# Patient Record
Sex: Female | Born: 1990 | Race: Black or African American | Hispanic: No | Marital: Single | Smoking: Former smoker
Health system: Southern US, Community
[De-identification: ages and names within clinical notes are randomized; demographics above are authoritative.]

## PROBLEM LIST (undated history)

## (undated) DIAGNOSIS — Z720 Tobacco use: Secondary | ICD-10-CM

## (undated) DIAGNOSIS — L239 Allergic contact dermatitis, unspecified cause: Secondary | ICD-10-CM

## (undated) DIAGNOSIS — I10 Essential (primary) hypertension: Secondary | ICD-10-CM

## (undated) DIAGNOSIS — L409 Psoriasis, unspecified: Secondary | ICD-10-CM

## (undated) DIAGNOSIS — I214 Non-ST elevation (NSTEMI) myocardial infarction: Secondary | ICD-10-CM

## (undated) HISTORY — PX: APPENDECTOMY: SHX54

## (undated) HISTORY — PX: NO PAST SURGERIES: SHX2092

---
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
## 2008-01-16 ENCOUNTER — Ambulatory Visit: Payer: Self-pay | Admitting: *Deleted

## 2008-01-16 ENCOUNTER — Ambulatory Visit: Payer: Self-pay | Admitting: Internal Medicine

## 2008-10-27 ENCOUNTER — Telehealth: Payer: Self-pay | Admitting: *Deleted

## 2008-11-28 ENCOUNTER — Ambulatory Visit: Payer: Self-pay | Admitting: Family Medicine

## 2009-01-07 ENCOUNTER — Telehealth: Payer: Self-pay | Admitting: Family Medicine

## 2009-02-03 ENCOUNTER — Encounter: Payer: Self-pay | Admitting: Family Medicine

## 2009-02-03 ENCOUNTER — Ambulatory Visit: Payer: Self-pay | Admitting: Family Medicine

## 2009-02-03 DIAGNOSIS — R112 Nausea with vomiting, unspecified: Secondary | ICD-10-CM | POA: Insufficient documentation

## 2009-02-03 DIAGNOSIS — N760 Acute vaginitis: Secondary | ICD-10-CM | POA: Insufficient documentation

## 2009-02-03 LAB — CONVERTED CEMR LAB: GC Probe Amp, Genital: NEGATIVE

## 2009-03-07 ENCOUNTER — Encounter (INDEPENDENT_AMBULATORY_CARE_PROVIDER_SITE_OTHER): Payer: Self-pay | Admitting: *Deleted

## 2009-03-07 DIAGNOSIS — F17200 Nicotine dependence, unspecified, uncomplicated: Secondary | ICD-10-CM | POA: Insufficient documentation

## 2009-09-11 ENCOUNTER — Ambulatory Visit: Payer: Self-pay | Admitting: Family Medicine

## 2009-09-11 DIAGNOSIS — L408 Other psoriasis: Secondary | ICD-10-CM | POA: Insufficient documentation

## 2010-07-20 NOTE — Assessment & Plan Note (Signed)
Summary: eczema,tcb   Vital Signs:  Patient profile:   20 year old female Height:      65.5 inches Weight:      114.4 pounds BMI:     18.82 Temp:     98.9 degrees F oral Pulse rate:   76 / minute BP sitting:   117 / 80  (right arm) Cuff size:   regular  Vitals Entered By: Garen Grams LPN (September 11, 2009 11:41 AM) CC: wants derm referral for eczema Is Patient Diabetic? No Pain Assessment Patient in pain? no        CC:  wants derm referral for eczema.  History of Present Illness: 1. Eczema:  Pt with chronic eczema.  She had seen Dermatologist in the past.  She is using the Triamcinolone frequently.  She is having to refill the small tube about every week.  She has it all over her body, worse on her extremities.  She does not use Vaseline or Eucerin because she was told to stop using it by a Dermatologist.  She takes short showers and uses Dove unscented soaps.  The eczema is very itchy and it really bothers her at night.  Has tried Benadryl which doesn't seem to help much.      ROS: denies any fevers or areas of skin breakdown that appear infected  2. Psoriasis:  She has scalp psoriasis.  This also diagnosed by a Dermatologist when she was younger.  She has been using the DermaSmooth on her scalp for years which does help.  She wants to know about any shampoo she can use to help.  Habits & Providers  Alcohol-Tobacco-Diet     Tobacco Status: current     Cigarette Packs/Day: 0.5  Current Medications (verified): 1)  Triamcinolone Acetonide 0.1 % Oint (Triamcinolone Acetonide) .... Apply To Affected Areas Two Times A Day To Three Times A Day As Needed 2)  Derma-Smoothe/fs Body 0.01 % Oil (Fluocinolone Acetonide) .... Apply Small Amount To Affected Area As Needed 3)  Hydroxyzine Hcl 25 Mg Tabs (Hydroxyzine Hcl) .... Take 1-2 Tabs At Night To Help With Itching  Allergies: No Known Drug Allergies  Past History:  Past Medical History: Extended hospitalization as infant for GI  disease? Psoriasis of scalp Severe eczema  Social History: Reviewed history from 08/17/2006 and no changes required. MGM and MGF have custody of pt and brother because mom wants to be free.   Current smoker - 1/2 ppd.  Denies illicit drug use.  Physical Exam  General:  vitals reviewed. alert and underweight appearing.  no acute distress Head:  normocephalic and atraumatic.   Mouth:  fair dentition, op without exudates.   Lungs:  normal respiratory effort.   Heart:  normal rate and regular rhythm.   Skin:  Scalp:  thinning of skin at the scalp line with some hypopigmentation.  Areas of psoriatic plaques in the scalp.  No alopecia. Skin: extensive areas of eczema.  Worse in arms and excoriations at the wrist.  Covers majority of skin surface.  No areas that appear infected. Psych:  not anxious appearing and not depressed appearing.     Impression & Recommendations:  Problem # 1:  ECZEMA, ATOPIC DERMATITIS (ICD-691.8) Assessment Unchanged  Is very extensive but she claims that it is under control.  Encouraged her to use an emoliant such as Vaseline.  She was told to stop using it before even though it seemed to be helping.  Refilled her Triamcinolone.  Would like her to  try and control the eczema by keeping the skin moisturized with Vaseline and then using the steroids intermittently for flares.  Also provided Hydroxyzine for the itching.  If not able to control it with these measures a Derm referral would probably be warranted. Her updated medication list for this problem includes:    Triamcinolone Acetonide 0.1 % Oint (Triamcinolone acetonide) .Marland Kitchen... Apply to affected areas two times a day to three times a day as needed    Derma-smoothe/fs Body 0.01 % Oil (Fluocinolone acetonide) .Marland Kitchen... Apply small amount to affected area as needed  Orders: [facility]- Est Level  3 (04540)  Problem # 2:  PSORIASIS, SCALP (ICD-696.1) Assessment: Unchanged  Still using the Derma-Smooth.  Concerned about the  chronic nature of her steroid use and possibility of skin thinning.  Recommended her to use T-gel or a tar based shampoo for chronic control and the steroids intermittently for flares.  Orders: [facility]- Est Level  3 (98119)  Problem # 3:  TOBACCO USER (ICD-305.1) Assessment: Unchanged  Counseled to quit  Orders: [facility]- Est Level  3 (14782)  Complete Medication List: 1)  Triamcinolone Acetonide 0.1 % Oint (Triamcinolone acetonide) .... Apply to affected areas two times a day to three times a day as needed 2)  Derma-smoothe/fs Body 0.01 % Oil (Fluocinolone acetonide) .... Apply small amount to affected area as needed 3)  Hydroxyzine Hcl 25 Mg Tabs (Hydroxyzine hcl) .... Take 1-2 tabs at night to help with itching  Patient Instructions: 1)  I have sent in a refill for the Triamcinolone cream 2)  You need to be sure to use a moisturizer like Vaseline or Eucerin 3)  There is a shampoo that is good for Psoriasis it is called T-Gel and you can get it over the counter.  Any tar based shampoo would work well. 4)  I have also sent in a prescription for an anti-itch medicine to the pharmacy. 5)  Please schedule a follow up appointment in 8 weeks to check on your eczema Prescriptions: HYDROXYZINE HCL 25 MG TABS (HYDROXYZINE HCL) Take 1-2 tabs at night to help with itching  #40 x 3   Entered and Authorized by:   Angelena Sole MD   Signed by:   Angelena Sole MD on 09/11/2009   Method used:   Electronically to        [facility] [phone]* (retail)       [address]       [place], [place]  [address]       Ph: [phone]       Fax: [phone]   RxID:   [id number] DERMA-SMOOTHE/FS BODY 0.01 % OIL (FLUOCINOLONE ACETONIDE) Apply small amount to affected area as needed  #1 x 6   Entered and Authorized by:   Angelena Sole MD   Signed by:   Angelena Sole MD on 09/11/2009   Method used:   Electronically to        [facility] [phone]* (retail)       [address]       [place], [place]  [address]       Ph: [phone]       Fax: [phone]   RxID:   [id number] TRIAMCINOLONE ACETONIDE 0.1 % OINT (TRIAMCINOLONE ACETONIDE) apply to affected areas two times a day to three times a day as needed  #1 x 6   Entered and Authorized by:   Angelena Sole MD   Signed by:   Vickki Muff  Lelon Perla MD on 09/11/2009   Method used:   Electronically to        [facility] [phone]* (retail)       [address]       [place], [place]  [address]       Ph: [phone]       Fax: [phone]   RxID:   [id number]

## 2011-03-10 LAB — POCT PREGNANCY, URINE
Operator id: 288831
Preg Test, Ur: NEGATIVE

## 2011-03-10 LAB — WET PREP, GENITAL

## 2011-05-29 ENCOUNTER — Emergency Department (HOSPITAL_COMMUNITY)
Admission: EM | Admit: 2011-05-29 | Discharge: 2011-05-29 | Disposition: A | Discharge status: Home / Self Care | Payer: Self-pay | Attending: Emergency Medicine | Admitting: Emergency Medicine

## 2011-05-29 ENCOUNTER — Encounter: Payer: Self-pay | Admitting: *Deleted

## 2011-05-29 DIAGNOSIS — L259 Unspecified contact dermatitis, unspecified cause: Secondary | ICD-10-CM | POA: Insufficient documentation

## 2011-05-29 DIAGNOSIS — L309 Dermatitis, unspecified: Secondary | ICD-10-CM

## 2011-05-29 DIAGNOSIS — L299 Pruritus, unspecified: Secondary | ICD-10-CM | POA: Insufficient documentation

## 2011-05-29 HISTORY — DX: Allergic contact dermatitis, unspecified cause: L23.9

## 2011-05-29 MED ORDER — TRIAMCINOLONE ACETONIDE 0.5 % EX OINT: TOPICAL_OINTMENT | Freq: Two times a day (BID) | CUTANEOUS | Status: DC

## 2011-05-29 MED ORDER — TRIAMCINOLONE ACETONIDE 0.1 % EX CREA: TOPICAL_CREAM | Freq: Two times a day (BID) | CUTANEOUS | Status: DC

## 2011-05-29 MED ORDER — HYDROXYZINE HCL 25 MG PO TABS: 25.0000 mg | ORAL_TABLET | Freq: Four times a day (QID) | ORAL | Status: DC

## 2011-05-29 NOTE — ED Provider Notes (Signed)
History     CSN: [id number] Arrival date & time: 05/29/2011 11:34 AM   First MD Initiated Contact with Patient 05/29/11 1250      Chief Complaint  Patient presents with  . Rash    (Consider location/radiation/quality/duration/timing/severity/associated sxs/prior treatment) Patient is a 20 y.o. female presenting with rash. The history is provided by the patient.  Rash   pt c/o eczematous rash, itching, esp to trunk and neck, w sparse involvement of extremities. Same symptoms previously x years. Requests refill of triamcinolone cream. Currently on no meds. Not using any regular moisturizer. Not using perfumed soaps or detergents. No new meds or change in meds, x out of steroid cream and antihistamine.  No fever or chills.   Past Medical History  Diagnosis Date  . Eczema, allergic     History reviewed. No pertinent past surgical history.  History reviewed. No pertinent family history.  History  Substance Use Topics  . Smoking status: Current Everyday Smoker -- 0.5 packs/day  . Smokeless tobacco: Not on file  . Alcohol Use: No    OB History    Grav Para Term Preterm Abortions TAB SAB Ect Mult Living                  Review of Systems  Constitutional: Negative for fever and chills.  Respiratory: Negative for shortness of breath and wheezing.   Cardiovascular: Negative for leg swelling.  Skin: Positive for rash.    Allergies  Review of patient's allergies indicates no known allergies.  Home Medications   Current Outpatient Rx  Name Route Sig Dispense Refill  . TRIAMCINOLONE ACETONIDE 0.1 % EX OINT Topical Apply 1 application topically 2 (two) times daily as needed. For itching     . HYDROXYZINE HCL 25 MG PO TABS Oral Take 25-50 mg by mouth at bedtime as needed. For itching      BP 132/71  Pulse 86  Temp(Src) 98.9 F (37.2 C) (Oral)  Resp 20  SpO2 100%  LMP 05/11/2011  Physical Exam  Nursing note and vitals reviewed. Constitutional: She appears  well-developed and well-nourished. No distress.  Eyes: Conjunctivae are normal. No scleral icterus.  Neck: Neck supple. No tracheal deviation present.  Cardiovascular: Normal rate.   Pulmonary/Chest: Effort normal. No respiratory distress.  Abdominal: Normal appearance.  Musculoskeletal: She exhibits no edema.  Neurological: She is alert.  Skin: Skin is warm and dry. Rash noted.       Eczematous rash to base of neck, upper trunk, sparse areas on extremities. No infection/cellulitis.   Psychiatric: She has a normal mood and affect.    ED Course  Procedures (including critical care time)     MDM  Pt requests refill of meds. Encouraged pcp/derm follow up.         Suzi Roots, MD 05/29/11 1302

## 2011-05-29 NOTE — ED Notes (Signed)
Reports having eczema, has rash and itching to back of neck.

## 2011-06-29 ENCOUNTER — Encounter (HOSPITAL_COMMUNITY): Payer: Self-pay | Admitting: *Deleted

## 2011-06-29 ENCOUNTER — Emergency Department (HOSPITAL_COMMUNITY)
Admission: EM | Admit: 2011-06-29 | Discharge: 2011-06-29 | Disposition: A | Discharge status: Home / Self Care | Payer: Self-pay

## 2011-06-29 NOTE — ED Notes (Signed)
Pt. Had unprotected sex and is concerned about having an STD. Pt. Has no s/s concerned about the unprotected sex that she had.

## 2011-06-29 NOTE — ED Notes (Signed)
Pt. reports that her eczema has gotten really bad and that she needs a prescription for it. Pt. Has sores on her neck and face.  Pt. Is requesting a script for the the larger tubes or the jar of the tricomceptalone cream.  Pt. Is asking for the larger amount because it is cheaper for her to purchase.

## 2011-06-29 NOTE — ED Notes (Signed)
Called back to stretcher 5 with no reply from triage waiting or main waiting.

## 2011-06-30 ENCOUNTER — Encounter (HOSPITAL_COMMUNITY): Payer: Self-pay

## 2011-06-30 ENCOUNTER — Emergency Department (HOSPITAL_COMMUNITY)
Admission: EM | Admit: 2011-06-30 | Discharge: 2011-06-30 | Disposition: A | Discharge status: Home / Self Care | Payer: Self-pay | Attending: Emergency Medicine | Admitting: Emergency Medicine

## 2011-06-30 DIAGNOSIS — R21 Rash and other nonspecific skin eruption: Secondary | ICD-10-CM | POA: Insufficient documentation

## 2011-06-30 DIAGNOSIS — L309 Dermatitis, unspecified: Secondary | ICD-10-CM

## 2011-06-30 DIAGNOSIS — L259 Unspecified contact dermatitis, unspecified cause: Secondary | ICD-10-CM | POA: Insufficient documentation

## 2011-06-30 MED ORDER — TRIAMCINOLONE ACETONIDE 0.1 % EX CREA: TOPICAL_CREAM | Freq: Two times a day (BID) | CUTANEOUS | Status: AC

## 2011-06-30 NOTE — ED Notes (Signed)
Discharge instructions reviewed with pt;  Verbalizes understanding.  Ambulatory to lobby

## 2011-06-30 NOTE — ED Notes (Signed)
Pt states that she has hx of ezcema and is requesting triamcinolone cream in an 80 gram tube for her rash.

## 2011-06-30 NOTE — ED Provider Notes (Signed)
History     CSN: [id number]  Arrival date & time 06/30/11  1045   First MD Initiated Contact with Patient 06/30/11 1222      Chief Complaint  Patient presents with  . Rash    (Consider location/radiation/quality/duration/timing/severity/associated sxs/prior treatment) Patient is a 21 y.o. female presenting with rash. The history is provided by the patient.  Rash  This is a chronic problem. The problem has not changed since onset.The problem is associated with nothing. There has been no fever. The pain is at a severity of 0/10. The patient is experiencing no pain. Pertinent negatives include no blisters, no itching, no pain and no weeping.   The patient has a history of a chronic eczema prefers to use Kenalog 0.1% ointment ran out of it a few days ago needs renewal cannot get in to see her primary care provider to get that done. No significant changes in her eczema she does not put the ointment on her face areas of eczema include mostly extremities and some on the trunk. No new or worse sxs. Past Medical History  Diagnosis Date  . Eczema, allergic     History reviewed. No pertinent past surgical history.  History reviewed. No pertinent family history.  History  Substance Use Topics  . Smoking status: Current Everyday Smoker -- 0.5 packs/day  . Smokeless tobacco: Not on file  . Alcohol Use: No    OB History    Grav Para Term Preterm Abortions TAB SAB Ect Mult Living                  Review of Systems  Constitutional: Negative for fever and chills.  HENT: Negative for neck pain.   Eyes: Negative for visual disturbance.  Respiratory: Negative for cough and shortness of breath.   Cardiovascular: Negative for chest pain.  Gastrointestinal: Negative for abdominal pain.  Genitourinary: Negative for dysuria.  Skin: Positive for rash. Negative for itching.  Neurological: Negative for headaches.  Hematological: Does not bruise/bleed easily.    Allergies  Review of  patient's allergies indicates no known allergies.  Home Medications   Current Outpatient Rx  Name Route Sig Dispense Refill  . TRIAMCINOLONE ACETONIDE 0.1 % EX OINT Topical Apply 1 application topically 2 (two) times daily as needed. For itching    . TRIAMCINOLONE ACETONIDE 0.1 % EX CREA Topical Apply topically 2 (two) times daily. Patient prefers this in an ointment if possible. 80 g 2    BP 112/75  Pulse 100  Temp(Src) 98.5 F (36.9 C) (Oral)  Resp 20  SpO2 100%  LMP 06/16/2011  Physical Exam  Nursing note and vitals reviewed. Constitutional: She is oriented to person, place, and time. She appears well-developed and well-nourished.  HENT:  Head: Normocephalic and atraumatic.  Mouth/Throat: Oropharynx is clear and moist.  Eyes: Conjunctivae and EOM are normal. Pupils are equal, round, and reactive to light.  Neck: Normal range of motion. Neck supple.  Cardiovascular: Normal rate, regular rhythm and normal heart sounds.   No murmur heard. Pulmonary/Chest: Effort normal and breath sounds normal.  Abdominal: Soft. Bowel sounds are normal. There is no tenderness.  Musculoskeletal: Normal range of motion. She exhibits no edema and no tenderness.  Lymphadenopathy:    She has no cervical adenopathy.  Neurological: She is alert and oriented to person, place, and time. No cranial nerve deficit. She exhibits normal muscle tone. Coordination normal.  Skin: Skin is warm. Rash noted.       Scattered areas of  eczema patches on the upper extremities and trunk. None on face.    ED Course  Procedures (including critical care time)  Labs Reviewed - No data to display No results found.   1. Eczema       MDM   Patient with long-standing history of eczema essentially here for refill on her Kenalog ointment she does have a primary care provider that she follows with was not able to get in to see him and she is out of her eczema medication. Prescription renewed for her.          Shelda Jakes, MD 06/30/11 1332

## 2013-03-15 ENCOUNTER — Emergency Department (HOSPITAL_COMMUNITY)
Admission: EM | Admit: 2013-03-15 | Discharge: 2013-03-15 | Disposition: A | Discharge status: Home / Self Care | Payer: Self-pay | Attending: Emergency Medicine | Admitting: Emergency Medicine

## 2013-03-15 ENCOUNTER — Encounter (HOSPITAL_COMMUNITY): Payer: Self-pay

## 2013-03-15 DIAGNOSIS — L309 Dermatitis, unspecified: Secondary | ICD-10-CM

## 2013-03-15 DIAGNOSIS — L259 Unspecified contact dermatitis, unspecified cause: Secondary | ICD-10-CM | POA: Insufficient documentation

## 2013-03-15 DIAGNOSIS — M67431 Ganglion, right wrist: Secondary | ICD-10-CM

## 2013-03-15 DIAGNOSIS — Z79899 Other long term (current) drug therapy: Secondary | ICD-10-CM | POA: Insufficient documentation

## 2013-03-15 DIAGNOSIS — M6740 Ganglion, unspecified site: Secondary | ICD-10-CM | POA: Insufficient documentation

## 2013-03-15 DIAGNOSIS — F17200 Nicotine dependence, unspecified, uncomplicated: Secondary | ICD-10-CM | POA: Insufficient documentation

## 2013-03-15 MED ORDER — TRIAMCINOLONE ACETONIDE 0.1 % EX CREA: TOPICAL_CREAM | Freq: Two times a day (BID) | CUTANEOUS | Status: DC

## 2013-03-15 NOTE — ED Notes (Signed)
Pt presents with a "cyst" to her Right posterior wrist x1 year, pt is requesting to have area removed. Pt also requesting a prescription for her eczema.

## 2013-03-15 NOTE — ED Provider Notes (Signed)
CSN: [id number]     Arrival date & time 03/15/13  4098 History  This chart was scribed for non-physician practitioner Magnus Sinning, PA-C working with Loren Racer, MD by Danella Maiers, ED Scribe. This patient was seen in room TR07C/TR07C and the patient's care was started at 11:35 PM.   Chief Complaint  Patient presents with  . Cyst   The history is provided by the patient. No language interpreter was used.   HPI Comments: Janice Esparza is a 22 y.o. female who presents to the Emergency Department complaining of a growing cyst to the dorsal aspect of the right wrist onset 8 months ago. She denies any pain or erythema to the area. She denies fever, chills, numbness. She also reports eczema to the arms, legs, and back.  She is requesting a refill of Triamcinolone prescription.     Past Medical History  Diagnosis Date  . Eczema, allergic    History reviewed. No pertinent past surgical history. History reviewed. No pertinent family history. History  Substance Use Topics  . Smoking status: Current Every Day Smoker -- 0.50 packs/day  . Smokeless tobacco: Not on file  . Alcohol Use: No   OB History   Grav Para Term Preterm Abortions TAB SAB Ect Mult Living                 Review of Systems  All other systems reviewed and are negative.    Allergies  Review of patient's allergies indicates no known allergies.  Home Medications   Current Outpatient Rx  Name  Route  Sig  Dispense  Refill  . triamcinolone ointment (KENALOG) 0.1 %   Topical   Apply 1 application topically 2 (two) times daily as needed. For itching          BP 126/84  Pulse 72  Temp(Src) 98.6 F (37 C) (Oral)  Resp 16  Ht 5\' 6"  (1.676 m)  Wt 115 lb (52.164 kg)  BMI 18.57 kg/m2  SpO2 99%  LMP 03/11/2013 Physical Exam  Nursing note and vitals reviewed. Constitutional: She appears well-developed and well-nourished.  HENT:  Head: Normocephalic and atraumatic.  Mouth/Throat: Oropharynx is clear  and moist.  Eyes: EOM are normal. Pupils are equal, round, and reactive to light.  Neck: Normal range of motion. Neck supple.  Cardiovascular: Normal rate, regular rhythm and normal heart sounds.   Pulmonary/Chest: Effort normal and breath sounds normal. She has no wheezes.  Musculoskeletal: Normal range of motion.       Right wrist: She exhibits normal range of motion and no tenderness.  Neurological: She is alert.  Skin: Skin is warm and dry.  Large 2cm ganglion cyst of the dorsal aspect of the right wrist.  No overlying erythema or warmth.  2+ dorsal pedis pulses bilaterally. Sensation of all fingers of right hand  Is intact.  Psychiatric: She has a normal mood and affect. Her behavior is normal.    ED Course  Procedures (including critical care time) Medications - No data to display  DIAGNOSTIC STUDIES: Oxygen Saturation is 99% on room air, normal by my interpretation.    COORDINATION OF CARE: 12:03 PM- Discussed treatment plan with pt which includes referral to hand surgeon and pt agrees to plan.    Labs Review Labs Reviewed - No data to display Imaging Review No results found.  MDM  No diagnosis found. Patient with a ganglion cyst to the dorsal aspect of the right wrist.  Patient given referral to Hand  Surgery to follow up with as needed.  I personally performed the services described in this documentation, which was scribed in my presence. The recorded information has been reviewed and is accurate.    Pascal Lux [place], PA-C 03/15/13 1513

## 2013-03-17 NOTE — ED Provider Notes (Signed)
Medical screening examination/treatment/procedure(s) were performed by non-physician practitioner and as supervising physician I was immediately available for consultation/collaboration.   Jadis Pitter, MD 03/17/13 1504 

## 2014-02-18 ENCOUNTER — Emergency Department (HOSPITAL_COMMUNITY)
Admission: EM | Admit: 2014-02-18 | Discharge: 2014-02-18 | Disposition: A | Discharge status: Home / Self Care | Payer: Self-pay | Attending: Emergency Medicine | Admitting: Emergency Medicine

## 2014-02-18 ENCOUNTER — Encounter (HOSPITAL_COMMUNITY): Payer: Self-pay | Admitting: Emergency Medicine

## 2014-02-18 ENCOUNTER — Emergency Department (HOSPITAL_COMMUNITY): Payer: Self-pay

## 2014-02-18 DIAGNOSIS — J029 Acute pharyngitis, unspecified: Secondary | ICD-10-CM | POA: Insufficient documentation

## 2014-02-18 DIAGNOSIS — F17200 Nicotine dependence, unspecified, uncomplicated: Secondary | ICD-10-CM | POA: Insufficient documentation

## 2014-02-18 DIAGNOSIS — J36 Peritonsillar abscess: Secondary | ICD-10-CM | POA: Insufficient documentation

## 2014-02-18 DIAGNOSIS — IMO0002 Reserved for concepts with insufficient information to code with codable children: Secondary | ICD-10-CM | POA: Insufficient documentation

## 2014-02-18 LAB — CBC
HCT: 35.4 % — ABNORMAL LOW (ref 36.0–46.0)
Hemoglobin: 12.2 g/dL (ref 12.0–15.0)
MCH: 30.3 pg (ref 26.0–34.0)
MCHC: 34.5 g/dL (ref 30.0–36.0)
MCV: 88.1 fL (ref 78.0–100.0)
PLATELETS: 222 10*3/uL (ref 150–400)
RBC: 4.02 MIL/uL (ref 3.87–5.11)
RDW: 11.7 % (ref 11.5–15.5)
WBC: 7 10*3/uL (ref 4.0–10.5)

## 2014-02-18 LAB — BASIC METABOLIC PANEL
ANION GAP: 13 (ref 5–15)
BUN: 12 mg/dL (ref 6–23)
CALCIUM: 9.3 mg/dL (ref 8.4–10.5)
CO2: 24 meq/L (ref 19–32)
CREATININE: 0.56 mg/dL (ref 0.50–1.10)
Chloride: 101 mEq/L (ref 96–112)
GFR calc Af Amer: >90 mL/min (ref >90)
GLUCOSE: 79 mg/dL (ref 70–99)
Potassium: 3.8 mEq/L (ref 3.7–5.3)
Sodium: 138 mEq/L (ref 137–147)

## 2014-02-18 LAB — MONONUCLEOSIS SCREEN: MONO SCREEN: NEGATIVE

## 2014-02-18 LAB — RAPID STREP SCREEN (MED CTR MEBANE ONLY): Streptococcus, Group A Screen (Direct): NEGATIVE

## 2014-02-18 MED ORDER — IOHEXOL 300 MG/ML  SOLN
80.0000 mL | Freq: Once | INTRAMUSCULAR | Status: AC | PRN
  Administered 2014-02-18: 80 mL via INTRAVENOUS

## 2014-02-18 MED ORDER — CLINDAMYCIN PHOSPHATE 600 MG/50ML IV SOLN
600.0000 mg | Freq: Once | INTRAVENOUS | Status: AC
  Administered 2014-02-18: 600 mg via INTRAVENOUS
  Filled 2014-02-18: qty 50

## 2014-02-18 MED ORDER — DEXAMETHASONE SODIUM PHOSPHATE 10 MG/ML IJ SOLN
10.0000 mg | Freq: Once | INTRAMUSCULAR | Status: AC
  Administered 2014-02-18: 10 mg via INTRAVENOUS
  Filled 2014-02-18: qty 1

## 2014-02-18 MED ORDER — CLINDAMYCIN HCL 150 MG PO CAPS: 300.0000 mg | ORAL_CAPSULE | Freq: Four times a day (QID) | ORAL | Status: AC

## 2014-02-18 MED ORDER — OXYCODONE-ACETAMINOPHEN 5-325 MG PO TABS: 1.0000 | ORAL_TABLET | Freq: Once | ORAL | Status: DC

## 2014-02-18 MED ORDER — OXYCODONE-ACETAMINOPHEN 5-325 MG PO TABS
1.0000 | ORAL_TABLET | Freq: Once | ORAL | Status: AC
  Administered 2014-02-18: 1 via ORAL
  Filled 2014-02-18: qty 1

## 2014-02-18 NOTE — ED Notes (Signed)
Pt presents with a sore throat since last Wednesday with difficulty swallowing.  Significant swelling noted to left side of throat- denies any difficulty breathing.  Airway intact.

## 2014-02-18 NOTE — ED Notes (Signed)
Patient discharged with all persona belongings.

## 2014-02-18 NOTE — ED Provider Notes (Signed)
CSN: [id number]     Arrival date & time 02/18/14  1504 History   First MD Initiated Contact with Patient 02/18/14 1756     Chief Complaint  Patient presents with  . Sore Throat    Patient is a 23 y.o. female presenting with pharyngitis.  Sore Throat This is a new problem. Episode onset: 1 wk. The problem occurs constantly. The problem has been gradually worsening. Associated symptoms include a sore throat. Pertinent negatives include no anorexia, chills, congestion, coughing, fever, headaches, nausea, neck pain, rash or vomiting. The symptoms are aggravated by drinking and eating. She has tried nothing for the symptoms.   Some drooling noted, has been able to eat solid/liquids but has pain on swallowing.  No pain with ranging neck.   Past Medical History  Diagnosis Date  . Eczema, allergic    History reviewed. No pertinent past surgical history. No family history on file. History  Substance Use Topics  . Smoking status: Current Every Day Smoker -- 0.50 packs/day  . Smokeless tobacco: Not on file  . Alcohol Use: Yes   OB History   Grav Para Term Preterm Abortions TAB SAB Ect Mult Living                 Review of Systems  Constitutional: Negative for fever and chills.  HENT: Positive for sore throat. Negative for congestion.   Respiratory: Negative for cough.   Gastrointestinal: Negative for nausea, vomiting and anorexia.  Musculoskeletal: Negative for neck pain.  Skin: Negative for rash.  Neurological: Negative for headaches.    Allergies  Review of patient's allergies indicates no known allergies.  Home Medications   Prior to Admission medications   Medication Sig Start Date End Date Taking? Authorizing Provider  triamcinolone cream (KENALOG) 0.1 % Apply topically 2 (two) times daily. 03/15/13   Heather Laisure, PA-C  triamcinolone ointment (KENALOG) 0.1 % Apply 1 application topically 2 (two) times daily as needed. For itching    Historical Provider, MD   BP 119/74   Pulse 94  Temp(Src) 100 F (37.8 C) (Oral)  Resp 18  Ht  (1.676 m)  Wt 115 lb (52.164 kg)  BMI 18.57 kg/m2  SpO2 99%  LMP 01/27/2014 Physical Exam  Nursing note and vitals reviewed. Constitutional: She is oriented to person, place, and time. She appears well-developed and well-nourished.  Handling secretions. Normal phonation.   HENT:  Head: Normocephalic and atraumatic.  Nose: Nose normal.  Left peritonsillar edema/erythema/fluctuance.  Floor space soft.  1.5 finger mouth opening.   Eyes: Conjunctivae are normal.  Neck: Normal range of motion. Neck supple. No tracheal deviation present.  TTP to left neck, FROM without pain  Cardiovascular: Normal rate, regular rhythm and normal heart sounds.   No murmur heard. Pulmonary/Chest: Effort normal and breath sounds normal. No stridor. No respiratory distress. She has no wheezes. She has no rales.  Abdominal: Soft. Bowel sounds are normal. She exhibits no distension and no mass. There is no tenderness.  Musculoskeletal: Normal range of motion. She exhibits no edema.  Neurological: She is alert and oriented to person, place, and time.  Skin: Skin is warm and dry. No rash noted.  Psychiatric: She has a normal mood and affect.    ED Course  Procedures (including critical care time) Labs Review Labs Reviewed  CBC - Abnormal; Notable for the following:    HCT 35.4 (*)    All other components within normal limits  RAPID STREP SCREEN  CULTURE, GROUP  A STREP  BASIC METABOLIC PANEL  MONONUCLEOSIS SCREEN    Imaging Review Ct Soft Tissue Neck W Contrast  02/18/2014   CLINICAL DATA:  Sore throat and suspected peritonsillar abscess  EXAM: CT NECK WITH CONTRAST  TECHNIQUE: Multidetector CT imaging of the neck was performed using the standard protocol following the bolus administration of intravenous contrast.  CONTRAST:  80mL OMNIPAQUE IOHEXOL 300 MG/ML  SOLN  COMPARISON:  None.  FINDINGS: There is considerable swelling of the  peritonsillar soft tissues on the left. There is irregular low density measuring 1.5 cm consistent with an abscess. Subtle less well defined low density extends posteriorly into the prevertebral space in the midline and to the left consistent with extension of inflammation. The right peritonsillar soft tissues exhibit mild enhancement and only minimal swelling and no evidence of an abscess. There is edema of the soft palate. There is mild shift of the airway towards the right.  There is no bulky anterior or posterior cervical lymphadenopathy. The parotid and submandibular glands are normal. The epiglottis and laryngeal structures are normal. The thyroid lobes enhance normally. The jugular and carotid vessels are normal.  There is reversal of the normal cervical lordosis consistent with muscle spasm. There is mild fullness the prevertebral soft tissues as discussed above suggesting some extension of inflammation without a discrete abscess.  IMPRESSION: 1. There is a left-sided peritonsillar and retroperitoneal abscess with extension posteriorly into the upper prevertebral space. There is mild mass effect upon the nasopharygeal and oropharyngeal airways. There is mild inflammatory change on the left without evidence of a discrete abscess. 2. There is no bulky lymphadenopathy. 3. These results were called by telephone at the time of interpretation on 02/18/2014 at 7:35 pm to Dr. Sofie Rower , who verbally acknowledged these results.   Electronically Signed   By: David  [place]   On: 02/18/2014 19:40     EKG Interpretation None      MDM   Final diagnoses:  Peritonsillar abscess    Pt presents with sore throat for one week, worsening.  Floor space soft. Low grade temp here.  No change in phonation appreciated, not drooling, airway protected without stridor. Exam c.w. left PTA, CT confirms.  FROM of neck without pain to indicate RPA, but CT does suggest retropharyngeal extension.  Gave decadron and clinda.  Mono and strep neg.   7:41 PM CT results in. ENT consulted, they want to see pt 9AM tomorrow as long as airway is patent.   8:05 PM Feels better, easier to drink and talk.  No dyspnea or stridor.    Dc home with clinda, analgesia.  Discussed strict return precautions for difficulty swallowing, breathing.     Sofie Rower, MD 02/19/14 [phone]

## 2014-02-19 NOTE — ED Provider Notes (Signed)
This patient was seen in conjunction with the resident physician.  The documentation accurately reflects the patient's ED encounter.  On my exam, the patient had trismus, pain in the left lateral throat. CT scan, which I reviewed, agree with the interpretation, demonstrates abscess, with some concern for progression to the prevertebral space. Patient improved substantially here with steroids, analgesics. We discussed her care with ENT Pollyann Kennedy) who requests to see the patient first thing in the AM.   Gerhard Munch, MD 02/19/14 [phone]

## 2014-02-20 LAB — CULTURE, GROUP A STREP

## 2014-02-25 ENCOUNTER — Emergency Department (HOSPITAL_COMMUNITY)
Admission: EM | Admit: 2014-02-25 | Discharge: 2014-02-25 | Disposition: A | Discharge status: Home / Self Care | Payer: Self-pay | Attending: Emergency Medicine | Admitting: Emergency Medicine

## 2014-02-25 ENCOUNTER — Encounter (HOSPITAL_COMMUNITY): Payer: Self-pay | Admitting: Emergency Medicine

## 2014-02-25 DIAGNOSIS — J029 Acute pharyngitis, unspecified: Secondary | ICD-10-CM | POA: Insufficient documentation

## 2014-02-25 DIAGNOSIS — J36 Peritonsillar abscess: Secondary | ICD-10-CM | POA: Insufficient documentation

## 2014-02-25 DIAGNOSIS — Z872 Personal history of diseases of the skin and subcutaneous tissue: Secondary | ICD-10-CM | POA: Insufficient documentation

## 2014-02-25 DIAGNOSIS — F17200 Nicotine dependence, unspecified, uncomplicated: Secondary | ICD-10-CM | POA: Insufficient documentation

## 2014-02-25 DIAGNOSIS — R6883 Chills (without fever): Secondary | ICD-10-CM | POA: Insufficient documentation

## 2014-02-25 MED ORDER — CLINDAMYCIN PHOSPHATE 600 MG/50ML IV SOLN
600.0000 mg | Freq: Once | INTRAVENOUS | Status: AC
  Administered 2014-02-25: 600 mg via INTRAVENOUS
  Filled 2014-02-25: qty 50

## 2014-02-25 MED ORDER — DEXAMETHASONE SODIUM PHOSPHATE 10 MG/ML IJ SOLN
10.0000 mg | Freq: Once | INTRAMUSCULAR | Status: AC
  Administered 2014-02-25: 10 mg via INTRAVENOUS
  Filled 2014-02-25: qty 1

## 2014-02-25 MED ORDER — SODIUM CHLORIDE 0.9 % IV BOLUS (SEPSIS)
1000.0000 mL | Freq: Once | INTRAVENOUS | Status: AC
  Administered 2014-02-25: 1000 mL via INTRAVENOUS

## 2014-02-25 NOTE — ED Notes (Signed)
Pt in from home c/o sore throat, states, "I was here last week & was told I had an abcess in my throat. I haven't been able to get in with where they told me to go. I can't afford my antibiotics until this Friday." pt c/o dysphagia, pt speech clear, no active drooling

## 2014-02-25 NOTE — ED Provider Notes (Signed)
CSN: [id number]     Arrival date & time 02/25/14  0749 History   First MD Initiated Contact with Patient 02/25/14 [phone]     Chief Complaint  Patient presents with  . Sore Throat     (Consider location/radiation/quality/duration/timing/severity/associated sxs/prior Treatment) HPI Comments: Patient presents emergency department with chief complaints of peritonsillar abscess. She states that she was seen here last week for the same, and was treated with clindamycin and Decadron. She was discharged to home with instructions followup with ENT the following day, but states that she was unable to because of insurance reasons. She states that additionally, she cannot afford to fill her prescription clindamycin. She states the throat and sore all week, but she did not notice significant swelling until this morning when she awoke. She denies measured temperature, but does report having chills. She states the pain is worsened with swallowing.  The history is provided by the patient. No language interpreter was used.    Past Medical History  Diagnosis Date  . Eczema, allergic    History reviewed. No pertinent past surgical history. No family history on file. History  Substance Use Topics  . Smoking status: Current Every Day Smoker -- 0.50 packs/day    Types: Cigarettes  . Smokeless tobacco: Not on file  . Alcohol Use: Yes   OB History   Grav Para Term Preterm Abortions TAB SAB Ect Mult Living                 Review of Systems  Constitutional: Positive for chills. Negative for fever.  HENT: Positive for sore throat.   Respiratory: Negative for shortness of breath.   Cardiovascular: Negative for chest pain.  Gastrointestinal: Negative for nausea, vomiting, diarrhea and constipation.  Genitourinary: Negative for dysuria.  All other systems reviewed and are negative.     Allergies  Review of patient's allergies indicates no known allergies.  Home Medications   Prior to Admission  medications   Medication Sig Start Date End Date Taking? Authorizing Provider  clindamycin (CLEOCIN) 150 MG capsule Take 2 capsules (300 mg total) by mouth every 6 (six) hours. 02/18/14 02/25/14  Sofie Rower, MD  oxyCODONE-acetaminophen (PERCOCET/ROXICET) 5-325 MG per tablet Take 1 tablet by mouth once. 02/18/14   Sofie Rower, MD   BP 119/83  Pulse 82  Temp(Src) 99.5 F (37.5 C) (Oral)  Ht  (1.676 m)  Wt 115 lb (52.164 kg)  BMI 18.57 kg/m2  SpO2 100%  LMP 02/25/2014 Physical Exam  Nursing note and vitals reviewed. Constitutional: She is oriented to person, place, and time. She appears well-developed and well-nourished.  HENT:  Head: Normocephalic and atraumatic.  Left-sided peritonsillar abscess with some exudates, tolerating secretions, airway is intact, no stridor, positive "hot potato voice"  Eyes: Conjunctivae and EOM are normal. Pupils are equal, round, and reactive to light.  Neck: Normal range of motion. Neck supple.  Cardiovascular: Normal rate and regular rhythm.  Exam reveals no gallop and no friction rub.   No murmur heard. Pulmonary/Chest: Effort normal and breath sounds normal. No respiratory distress. She has no wheezes. She has no rales. She exhibits no tenderness.  Abdominal: Soft. Bowel sounds are normal. She exhibits no distension and no mass. There is no tenderness. There is no rebound and no guarding.  Musculoskeletal: Normal range of motion. She exhibits no edema and no tenderness.  Neurological: She is alert and oriented to person, place, and time.  Skin: Skin is warm and dry.  Psychiatric: She has a  normal mood and affect. Her behavior is normal. Judgment and thought content normal.    ED Course  Procedures (including critical care time) Labs Review Labs Reviewed - No data to display  Imaging Review No results found.   EKG Interpretation None      MDM   Final diagnoses:  Peritonsillar abscess    Patient with peritonsillar abscess.  Will treat  with clinda and decadron.  Patient discussed with Dr. Effie Shy, who recommends consulting Dr. Pollyann Kennedy, who was originally consulted on this patient.  8:43 AM Patient discussed with Dr. Annalee Genta from ENT.  Dr. Pollyann Kennedy is out of the office.  Dr. Annalee Genta recommends follow-up in his office at 1:00 today.  Recommends giving 2 L of fluid in addition to the clinda and decadron that we are giving now.  11:49 AM Finished fluids, DC to Dr. Lucky Rathke office.  Symptoms unchanged, protecting airway.   Roxy Horseman, PA-C 02/25/14 1150

## 2014-02-25 NOTE — ED Notes (Signed)
Patient was given a cup of ice water. 

## 2014-02-25 NOTE — Discharge Instructions (Signed)
Peritonsillar Abscess °A peritonsillar abscess is a collection of pus located in the back of the throat behind the tonsils. It usually occurs when a streptococcal infection of the throat or tonsils spreads into the space around the tonsils. They are almost always caused by the streptococcal germ (bacteria). The treatment of a peritonsillar abscess is most often drainage accomplished by putting a needle into the abscess or cutting (incising) and draining the abscess. This is most often followed with a course of antibiotics. °HOME CARE INSTRUCTIONS °· If your abscess was drained by your caregiver today, rinse your throat (gargle) with warm salt water four times per day or as needed for comfort. Do not swallow this mixture. Mix 1 teaspoon of salt in 8 ounces of warm water for gargling. °· Rest in bed as needed. Resume activities as able. °· Apply cold to your neck for pain relief. Fill a plastic bag with ice and wrap it in a towel. Hold the ice on your neck for 20 minutes 4 times per day. °· Eat a soft or liquid diet as tolerated while your throat remains sore. Popsicles and ice cream may be good early choices. Drinking plenty of cold fluids will probably be soothing and help take swelling down in between the warm gargles. °· Only take over-the-counter or prescription medicines for pain, discomfort, or fever as directed by your caregiver. Do not use aspirin unless directed by your physician. Aspirin slows down the clotting process. It can also cause bleeding from the drainage area if this was needled or incised today. °· If antibiotics were prescribed, take them as directed for the full course of the prescription. Even if you feel you are well, you need to take them. °SEEK MEDICAL CARE IF:  °· You have increased pain, swelling, redness, or drainage in your throat. °· You develop signs of infection such as dizziness, headache, lethargy, or generalized feelings of illness. °· You have difficulty breathing, swallowing or  eating. °· You show signs of becoming dehydrated (lightheadedness when standing, decreased urine output, a fast heart rate, or dry mouth and mucous membranes). °SEEK IMMEDIATE MEDICAL CARE IF:  °· You have a fever. °· You are coughing up or vomiting blood. °· You develop more severe throat pain uncontrolled with medicines or you start to drool. °· You develop difficulty breathing, talking, or find it easier to breathe while leaning forward. °Document Released: 06/06/2005 Document Revised: 08/29/2011 Document Reviewed: 01/18/2008 °ExitCare® Patient Information ©2015 [employer]. This information is not intended to replace advice given to you by your health care provider. Make sure you discuss any questions you have with your health care provider. ° °

## 2014-02-27 NOTE — ED Provider Notes (Signed)
Medical screening examination/treatment/procedure(s) were performed by non-physician practitioner and as supervising physician I was immediately available for consultation/collaboration.   EKG Interpretation None       Shaquela Weichert L Cailee Blanke, MD 02/27/14 1413 

## 2014-05-26 ENCOUNTER — Encounter (HOSPITAL_COMMUNITY): Payer: Self-pay | Admitting: Physical Medicine and Rehabilitation

## 2014-05-26 ENCOUNTER — Emergency Department (HOSPITAL_COMMUNITY)
Admission: EM | Admit: 2014-05-26 | Discharge: 2014-05-26 | Disposition: A | Discharge status: Home / Self Care | Payer: Self-pay | Attending: Emergency Medicine | Admitting: Emergency Medicine

## 2014-05-26 DIAGNOSIS — H019 Unspecified inflammation of eyelid: Secondary | ICD-10-CM | POA: Insufficient documentation

## 2014-05-26 DIAGNOSIS — Z720 Tobacco use: Secondary | ICD-10-CM | POA: Insufficient documentation

## 2014-05-26 DIAGNOSIS — L309 Dermatitis, unspecified: Secondary | ICD-10-CM | POA: Insufficient documentation

## 2014-05-26 DIAGNOSIS — H5789 Other specified disorders of eye and adnexa: Secondary | ICD-10-CM

## 2014-05-26 MED ORDER — TETRACAINE HCL 0.5 % OP SOLN
1.0000 [drp] | Freq: Once | OPHTHALMIC | Status: AC
  Administered 2014-05-26: 1 [drp] via OPHTHALMIC
  Filled 2014-05-26: qty 2

## 2014-05-26 MED ORDER — ERYTHROMYCIN 5 MG/GM OP OINT: TOPICAL_OINTMENT | OPHTHALMIC | Status: DC

## 2014-05-26 MED ORDER — FLUORESCEIN SODIUM 1 MG OP STRP
1.0000 | ORAL_STRIP | Freq: Once | OPHTHALMIC | Status: AC
  Administered 2014-05-26: 1 via OPHTHALMIC
  Filled 2014-05-26: qty 1

## 2014-05-26 NOTE — ED Provider Notes (Signed)
CSN: [id number]     Arrival date & time 05/26/14  [id number] History   First MD Initiated Contact with Patient 05/26/14 [phone]     Chief Complaint  Patient presents with  . Eye Pain     (Consider location/radiation/quality/duration/timing/severity/associated sxs/prior Treatment) HPI Pt is a 23yo female with hx of eczema presenting to ED with c/o 2-3 month hx of gradually worsening bilateral upper eyelid irritation with discharge. 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  States her eyelids feel itchy but do not hurt. Denies change in vision.  Pt does not wear contacts or eyeglasses. Reports trying vaseline at night to help w/o relief.  Also reports contemplating using her triamcinolone cream she uses for her eczema but she has not tried yet as she was concerned it would make things worse. She does not have an optometrist or ophthalmologist. Denies fever, chills, cough, or congestion. No new soaps or detergents. No known allergies.   Past Medical History  Diagnosis Date  . Eczema, allergic    History reviewed. No pertinent past surgical history. No family history on file. History  Substance Use Topics  . Smoking status: Current Every Day Smoker -- 0.50 packs/day    Types: Cigarettes  . Smokeless tobacco: Not on file  . Alcohol Use: Yes   OB History    No data available     Review of Systems  Constitutional: Negative for fever and chills.  HENT: Negative for congestion, rhinorrhea and sore throat.   Eyes: Positive for pain, discharge ( eyelids), redness and itching. Negative for photophobia and visual disturbance.  Respiratory: Negative for cough and shortness of breath.   Gastrointestinal: Negative for nausea and vomiting.  Neurological: Negative for light-headedness and headaches.  All other systems reviewed and are  negative.     Allergies  Review of patient's allergies indicates no known allergies.  Home Medications   Prior to Admission medications   Medication Sig Start Date End Date Taking? Authorizing Provider  erythromycin ophthalmic ointment Place a 1/2 inch ribbon of ointment onto upper eyelid. 05/26/14   Junius FinnerErin O'Malley, PA-C  oxyCODONE-acetaminophen (PERCOCET/ROXICET) 5-325 MG per tablet Take 1 tablet by mouth once. Patient not taking: Reported on 05/26/2014 02/18/14   Sofie RowerMike Stengel, MD   BP 125/86 mmHg  Pulse 63  Temp(Src) 98.8 F (37.1 C) (Oral)  Resp 16  Ht 5\' 6"  (1.676 m)  Wt 115 lb (52.164 kg)  BMI 18.57 kg/m2  SpO2 100% Physical Exam  Constitutional: She is oriented to person, place, and time. She appears well-developed and well-nourished.  HENT:  Head: Normocephalic and atraumatic.  Eyes: Conjunctivae and EOM are normal. Pupils are equal, round, and reactive to light. Lids are everted and swept, no foreign bodies found. Right eye exhibits discharge and hordeolum. Right eye exhibits no chemosis and no exudate. No foreign body present in the right eye. Left eye exhibits discharge. Left eye exhibits no chemosis, no exudate and no hordeolum. No foreign body present in the left eye. Right conjunctiva is not injected. Right conjunctiva has no hemorrhage. Left conjunctiva is not injected. Left conjunctiva has no hemorrhage. No scleral icterus.  Slit lamp exam:      The right eye shows no corneal abrasion, no corneal ulcer, no foreign body and no fluorescein uptake.       The left  eye shows no corneal abrasion, no corneal ulcer, no foreign body and no fluorescein uptake.  Bilateral upper eyelid erythema and irritation with scant white/yellow discharge to right upper eyelid and scant red blood. Minimal eyelashes present.   Neck: Normal range of motion.  Cardiovascular: Normal rate.   Pulmonary/Chest: Effort normal.  Musculoskeletal: Normal range of motion.  Neurological: She is alert and  oriented to person, place, and time.  Skin: Skin is warm and dry.  Psychiatric: She has a normal mood and affect. Her behavior is normal.  Nursing note and vitals reviewed.   ED Course  Procedures (including critical care time) Labs Review Labs Reviewed - No data to display  Imaging Review No results found.   EKG Interpretation None      MDM   Final diagnoses:  Infection of eyelid  Irritation of both eyes  Eczema   Pt c/o bilateral upper eyelid irritation and crusting for 2-3 months.  Coworkers are now concerned she has pink eye.  Exam is c/w blepharitis. Will tx with erythromycin ointment. Home care instructions provided. Also advised pt to f/u with PCP and/or Dr. Allena KatzPatel, ophthalmology in 1 week if symptoms not improving. Pt verbalized understanding and agreement with tx plan.   Junius Finnerrin O'Malley, PA-C 05/26/14 1058  Nathan R. Rubin PayorPickering, MD 05/27/14 1255

## 2014-05-26 NOTE — Discharge Instructions (Signed)
Use the antibiotic ointment for up to 5 days.   If no improvement after 3 days, or symptoms worsening, discontinue the use of the medication and seek medical evaluation for recheck of symptoms. See below for further instructions.   Blepharitis Blepharitis is a skin problem that makes your eyelids watery, red, puffy (swollen), crusty, scaly, or painful. It may also make your eyes itch. You may lose eyelashes. HOME CARE  Keep your hands clean.  Use a clean towel each time you dry your eyelids. Do not share towels or makeup with anyone.  Carefully wash your eyelids and eyelashes 2 times a day. Use warm water and baby shampoo or just water.  Wash your face and eyebrows at least once a day.  Hold a folded washcloth under warm water. Squeeze the water out. Put the warm washcloth on your eyes 2 times a day for 10 minutes, or as told by your doctor.  Apply medicated cream as told by your doctor.  Avoid rubbing your eyes.  Avoid wearing makeup until you get better.  Follow up with your doctor as told. GET HELP RIGHT AWAY IF:  Your pain, redness, or puffiness gets worse.  Your pain, redness, or puffiness spreads to other parts of your face.  Your vision changes, or you have pain when looking at lights or moving objects.  You have a fever.  You do not get better after 2 to 4 days. MAKE SURE YOU:  Understand these instructions.  Will watch your condition.  Will get help right away if you are not doing well or get worse. Document Released: 03/15/2008 Document Revised: 08/29/2011 Document Reviewed: 07/14/2010 [facility] Patient Information 2015 [place], [place]. This information is not intended to replace advice given to you by your health care provider. Make sure you discuss any questions you have with your health care provider.

## 2014-05-26 NOTE — ED Notes (Addendum)
PT refused a wheelchair and was ambulated by this RN from ED. Pt is in stable condition.

## 2014-05-26 NOTE — ED Notes (Signed)
Pt presents to department for evaluation of bilateral eye irritation. Ongoing for several weeks. No signs of distress noted.

## 2014-06-05 ENCOUNTER — Encounter (HOSPITAL_COMMUNITY): Payer: Self-pay | Admitting: Emergency Medicine

## 2014-06-05 ENCOUNTER — Emergency Department (HOSPITAL_COMMUNITY)
Admission: EM | Admit: 2014-06-05 | Discharge: 2014-06-05 | Disposition: A | Discharge status: Home / Self Care | Payer: No Typology Code available for payment source | Attending: Emergency Medicine | Admitting: Emergency Medicine

## 2014-06-05 DIAGNOSIS — S3992XA Unspecified injury of lower back, initial encounter: Secondary | ICD-10-CM | POA: Insufficient documentation

## 2014-06-05 DIAGNOSIS — Y9389 Activity, other specified: Secondary | ICD-10-CM | POA: Diagnosis not present

## 2014-06-05 DIAGNOSIS — S0990XA Unspecified injury of head, initial encounter: Secondary | ICD-10-CM | POA: Insufficient documentation

## 2014-06-05 DIAGNOSIS — Y92410 Unspecified street and highway as the place of occurrence of the external cause: Secondary | ICD-10-CM | POA: Diagnosis not present

## 2014-06-05 DIAGNOSIS — Y998 Other external cause status: Secondary | ICD-10-CM | POA: Diagnosis not present

## 2014-06-05 DIAGNOSIS — Z872 Personal history of diseases of the skin and subcutaneous tissue: Secondary | ICD-10-CM | POA: Diagnosis not present

## 2014-06-05 DIAGNOSIS — Z720 Tobacco use: Secondary | ICD-10-CM | POA: Diagnosis not present

## 2014-06-05 DIAGNOSIS — M545 Low back pain, unspecified: Secondary | ICD-10-CM

## 2014-06-05 MED ORDER — IBUPROFEN 200 MG PO TABS
600.0000 mg | ORAL_TABLET | Freq: Once | ORAL | Status: AC
  Administered 2014-06-05: 600 mg via ORAL
  Filled 2014-06-05: qty 3

## 2014-06-05 MED ORDER — HYDROCODONE-ACETAMINOPHEN 5-325 MG PO TABS
1.0000 | ORAL_TABLET | Freq: Once | ORAL | Status: AC
  Administered 2014-06-05: 1 via ORAL
  Filled 2014-06-05: qty 1

## 2014-06-05 MED ORDER — HYDROCODONE-ACETAMINOPHEN 5-325 MG PO TABS: 1.0000 | ORAL_TABLET | Freq: Four times a day (QID) | ORAL | Status: DC | PRN

## 2014-06-05 MED ORDER — CYCLOBENZAPRINE HCL 10 MG PO TABS
10.0000 mg | ORAL_TABLET | Freq: Every day | ORAL | Status: DC
Start: 2014-06-05 — End: 2014-10-15

## 2014-06-05 NOTE — Discharge Instructions (Signed)
Call for a follow up appointment with a Family or Primary Care Provider.  Return if Symptoms worsen.   Take medication as prescribed.  Ice your back 3-4 times a day. Do not operate heavy machinery or drink alcohol while taking narcotic pain medication or muscle relaxant. Ibuprofen every 8 hours for pain.   Emergency Department Resource Guide 1) Find a Doctor and Pay Out of Pocket Although you won't have to find out who is covered by your insurance plan, it is a good idea to ask around and get recommendations. You will then need to call the office and see if the doctor you have chosen will accept you as a new patient and what types of options they offer for patients who are self-pay. Some doctors offer discounts or will set up payment plans for their patients who do not have insurance, but you will need to ask so you aren't surprised when you get to your appointment.  2) Contact Your Local Health Department Not all health departments have doctors that can see patients for sick visits, but many do, so it is worth a call to see if yours does. If you don't know where your local health department is, you can check in your phone book. The CDC also has a tool to help you locate your state's health department, and many state websites also have listings of all of their local health departments.  3) Find a Walk-in Clinic If your illness is not likely to be very severe or complicated, you may want to try a walk in clinic. These are popping up all over the country in pharmacies, drugstores, and shopping centers. They're usually staffed by nurse practitioners or physician assistants that have been trained to treat common illnesses and complaints. They're usually fairly quick and inexpensive. However, if you have serious medical issues or chronic medical problems, these are probably not your best option.  No Primary Care Doctor: - Call Health Connect at  [phone] - they can help you locate a primary care doctor  that  accepts your insurance, provides certain services, etc. - Physician Referral Service- [phone]  Chronic Pain Problems: Organization         Address  Phone   Notes  [facility]  [phone] Patients need to be referred by their primary care doctor.   Medication Assistance: Organization         Address  Phone   Notes  [facility] [address] [place], [place] [address] [phone] --Must be a resident of [facility] -- Must have NO insurance coverage whatsoever (no Medicaid/ Medicare, etc.) -- The pt. MUST have a primary care doctor that directs their care regularly and follows them in the community   MedAssist  [phone]   [employer]  [phone]    Agencies that provide inexpensive medical care: Organization         Address  Phone   Notes  [facility]  [phone]   [facility]    [phone]   [facility] [address] [place], [place] [address] [phone]   [facility] [address], [place] [phone]   Planned Parenthood    [phone]   [facility]    [phone]   [facility]  [address], [place] Phone:  [phone], Fax:  [phone] Hours  of Operation:  9 am - 6 pm, M-F.  Also accepts Medicaid/Medicare and self-pay.  [facility] for [place], [address], [place] Phone: [phone], Fax: [phone]. Hours of Operation:  8:30 am - 5:30 pm, M-F.  Also accepts Medicaid and self-pay.  [facility] [place] [address], [place] Phone: [phone]   [place], [place], [place] [phone], Ext. [phone] Mondays & Thursdays: 7-9 AM.  First 15 patients are seen on a first come, first serve basis.    [place] Providers:  Organization          Address  Phone   Notes  [facility] [address], [place] [phone] Also accepts self-pay patients.  [facility] [address], [place]  [phone]   [place], [address], [place] [phone]   [facility] [address] [place] [phone]   Lucianne Lei [address], [place]   [phone] Only accepts [place] Access [place] patients after they have their name applied to their card.   Self-Pay (no insurance) in [facility]:  Organization         Address  Phone   Notes  Sickle Cell Patients, [facility] [place] [phone]   [facility] [place] [phone]   [facility] [place]  [place], [place], [place] [phone]   [facility]/Dr. Osei-Bonsu  [address] [place] or [address], [place] [phone] Phone number for both [place] and [place] locations is the same.  Urgent Medical and [facility] [address] [place] [phone]   [facility] [address] [place] or [address] [phone]   [facility] [address] [place] [phone], phone; [phone], fax Sees patients 1st and 3rd Saturday of every month.  Must not qualify for public or private insurance (i.e. Medicaid, Medicare, [place] Health Choice, Veterans' Benefits)  Household income should be no more than 200% of the poverty level The clinic cannot treat you if you are pregnant or think you are pregnant  Sexually transmitted diseases are not treated at the clinic.    Dental Care: Organization         Address  Phone  Notes  [facility] [place] [phone] Accepts children up to age 5 who are enrolled in [place] or [place]; pregnant women with a Medicaid card; and children who have applied for Medicaid or [place] Health Choice, but were declined, whose parents can pay a reduced fee at time of service.  [facility] Department of [facility]  [address], [place] [phone] Accepts children up to age 65 who are enrolled in [place] or [place]; pregnant women with a Medicaid card; and children who have applied for Medicaid or [place] Health Choice, but were declined, whose parents can pay a reduced fee at time of service.  [facility] Access PROGRAM  [place] [phone] Patients are seen by appointment only. Walk-ins are not accepted. [place] will see  patients 61 years of age and older. Monday - Tuesday (8am-5pm) Most Wednesdays (8:30-5pm) $30 per visit, cash only  [facility] Adult Dental Access PROGRAM  [address], [facility] [phone] Patients are seen by appointment only. Walk-ins are not accepted. [place] will see patients 29 years of age and older. One Wednesday Evening (Monthly: Volunteer Based).  $30 per visit, cash only  [place]  [phone] for adults; Children under age 71, call Graduate Pediatric Dentistry at [phone]. Children aged 36-14, please call [phone] to request a pediatric application.  Dental services are provided in all areas of dental care including fillings, crowns and bridges, complete and partial dentures, implants, gum treatment, root canals, and extractions. Preventive care is also provided. Treatment is provided to both adults and children. Patients are selected via a lottery and there is often a waiting list.   [facility] [address], [place]  [phone] [email or web address]   [facility] [address] [place], [place]  [phone], Ext. [phone] Second and Fourth Thursday of each month, opens at 6:30 AM; Clinic ends at 9 AM.  Patients are seen on a first-come first-served basis, and a limited number are seen during each clinic.   [facility]  [address] Hillard Danker [place], [place] [phone]   Eligibility Requirements You must have lived in [place], [place], or [place] counties for at least the last three months.   You cannot be eligible for state or federal sponsored [employer], including [employer], [place], or [employer].   You generally cannot be eligible for healthcare insurance through your employer.    How to apply: Eligibility screenings are held every Tuesday and Wednesday afternoon from 1:00 pm until 4:00 pm. You do not need an appointment for the interview!  [facility] [address] [place], [place]   [place]  [place] Department  [place]  [phone]    Behavioral Health Resources in the Community: Intensive Outpatient Programs Organization         Address  Phone  Notes  Leona Louin. [address] [place], [place] [phone]   [facility]patient [address], [place], [place]   ADS: Alcohol & Drug Svcs [address] [place], [place]   [place] [address]  [place], [place] or [phone]   Substance Abuse Resources Organization         Address  Phone  Notes  Alcohol and Drug Services  [phone]   [place]  [phone]   [facility] [place]   Chinita Pester  [phone]   Residential & Outpatient Substance Abuse Program  [phone]   Psychological Services Organization         Address  Phone  Notes  [facility]  [place]  [phone]    [facility] [address] [place] or [phone]    Mobile Crisis Teams Organization         Address  Phone  Notes  [facility], Mobile Crisis Care Unit  [phone]   [facility]ive Psychotherapeutic Services  [address] [place], [place]   [facility] [address] [place] [phone]    Self-Help/Support Groups Organization  Address  Phone             Notes  [place]. of [place] - variety of support groups  [place] Call for more information  Narcotics Anonymous (NA), [facility] [address], [employer] [place]  2 meetings at this location   Special educational needs teacher         Address  Phone  Notes  ASAP Residential Treatment [place],    [place]  [phone]   [facility]  [address] [place] [phone], [place], [place]   [place], [place] [phone] Admissions: 8am-3pm M-F  Incentives Substance [place] [address]    [place], [place] [phone]   [facility] [address] [place], [place], [place]   [facility] [address]  [place], [place]   [facility] Programs - Intensive Outpatient [address], [place], [place]   [facility] ([place] [address].,  [place], [place] [phone] or [phone]   [facility]idential Treatment Services (RTS) [address] [place], [place] Accepts Medicaid  [facility] [place] [address]  [place] [phone] Substance Abuse/Addiction Treatment   [facility] Organization         Address  Phone  Notes  [facility]  [phone]   Domenic Schwab, PhD [address] Arlis Porta [place], [place]   [phone] or [phone]   [place]  [place], [place] [phone]   [facility] [address], [place], [place] [phone] Insurance/Medicaid/sponsorship through [facility] and Families [address] [place]                                    [place], [place] [phone] [place] [address], [place] [phone]    Dr. Adele Schilder  [phone]   [facility] Dept. 1) [address] [place] 2) [place] 3)  [place] [address], [place] [phone]  [phone]   [place] [phone] or [phone] (After Hours)
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
## 2014-10-12 ENCOUNTER — Emergency Department (HOSPITAL_COMMUNITY): Payer: No Typology Code available for payment source

## 2014-10-12 ENCOUNTER — Emergency Department (HOSPITAL_COMMUNITY)
Admission: EM | Admit: 2014-10-12 | Discharge: 2014-10-12 | Disposition: A | Discharge status: Home / Self Care | Payer: No Typology Code available for payment source | Attending: Emergency Medicine | Admitting: Emergency Medicine

## 2014-10-12 ENCOUNTER — Encounter (HOSPITAL_COMMUNITY): Payer: Self-pay

## 2014-10-12 DIAGNOSIS — S60512A Abrasion of left hand, initial encounter: Secondary | ICD-10-CM | POA: Insufficient documentation

## 2014-10-12 DIAGNOSIS — Y998 Other external cause status: Secondary | ICD-10-CM | POA: Diagnosis not present

## 2014-10-12 DIAGNOSIS — Y92410 Unspecified street and highway as the place of occurrence of the external cause: Secondary | ICD-10-CM | POA: Diagnosis not present

## 2014-10-12 DIAGNOSIS — Z720 Tobacco use: Secondary | ICD-10-CM | POA: Diagnosis not present

## 2014-10-12 DIAGNOSIS — Y9389 Activity, other specified: Secondary | ICD-10-CM | POA: Insufficient documentation

## 2014-10-12 DIAGNOSIS — Z872 Personal history of diseases of the skin and subcutaneous tissue: Secondary | ICD-10-CM | POA: Insufficient documentation

## 2014-10-12 DIAGNOSIS — S6992XA Unspecified injury of left wrist, hand and finger(s), initial encounter: Secondary | ICD-10-CM | POA: Diagnosis present

## 2014-10-12 DIAGNOSIS — S60312A Abrasion of left thumb, initial encounter: Secondary | ICD-10-CM | POA: Diagnosis not present

## 2014-10-12 MED ORDER — IBUPROFEN 600 MG PO TABS: 600.0000 mg | ORAL_TABLET | Freq: Four times a day (QID) | ORAL | Status: DC | PRN
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
## 2014-10-15 ENCOUNTER — Encounter (HOSPITAL_COMMUNITY): Payer: Self-pay | Admitting: Emergency Medicine

## 2014-10-15 ENCOUNTER — Emergency Department (INDEPENDENT_AMBULATORY_CARE_PROVIDER_SITE_OTHER): Payer: Self-pay

## 2014-10-15 ENCOUNTER — Emergency Department (INDEPENDENT_AMBULATORY_CARE_PROVIDER_SITE_OTHER)
Admission: EM | Admit: 2014-10-15 | Discharge: 2014-10-15 | Disposition: A | Discharge status: Home / Self Care | Payer: Self-pay | Source: Home / Self Care | Attending: Family Medicine | Admitting: Family Medicine

## 2014-10-15 DIAGNOSIS — R0789 Other chest pain: Secondary | ICD-10-CM

## 2014-10-15 DIAGNOSIS — S20219A Contusion of unspecified front wall of thorax, initial encounter: Secondary | ICD-10-CM

## 2014-10-15 DIAGNOSIS — R071 Chest pain on breathing: Secondary | ICD-10-CM

## 2014-10-15 MED ORDER — DICLOFENAC POTASSIUM 50 MG PO TABS: 50.0000 mg | ORAL_TABLET | Freq: Three times a day (TID) | ORAL | Status: DC
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
## 2015-03-08 ENCOUNTER — Emergency Department (HOSPITAL_COMMUNITY)
Admission: EM | Admit: 2015-03-08 | Discharge: 2015-03-08 | Disposition: A | Discharge status: Home / Self Care | Payer: Self-pay | Attending: Emergency Medicine | Admitting: Emergency Medicine

## 2015-03-08 ENCOUNTER — Encounter (HOSPITAL_COMMUNITY): Payer: Self-pay | Admitting: *Deleted

## 2015-03-08 DIAGNOSIS — Z720 Tobacco use: Secondary | ICD-10-CM | POA: Insufficient documentation

## 2015-03-08 DIAGNOSIS — L409 Psoriasis, unspecified: Secondary | ICD-10-CM | POA: Insufficient documentation

## 2015-03-08 MED ORDER — TRIAMCINOLONE ACETONIDE 0.025 % EX CREA: 1.0000 "application " | TOPICAL_CREAM | Freq: Two times a day (BID) | CUTANEOUS | Status: DC

## 2015-03-08 MED ORDER — DERMA-SMOOTHE/FS SCALP 0.01 % EX OIL: 1.0000 | TOPICAL_OIL | Freq: Every evening | CUTANEOUS | Status: DC

## 2015-03-08 NOTE — ED Provider Notes (Signed)
CSN: [id number]     Arrival date & time 03/08/15  0450 History   First MD Initiated Contact with Patient 03/08/15 0700     Chief Complaint  Patient presents with  . Rash     (Consider location/radiation/quality/duration/timing/severity/associated sxs/prior Treatment) Patient is a 24 y.o. female presenting with rash. The history is provided by the patient.  Rash Location:  Head/neck Head/neck rash location:  Scalp Quality: blistering, painful, scaling and weeping   Pain details:    Quality:  Aching and burning   Severity:  Moderate   Onset quality:  Sudden   Duration:  1 day   Timing:  Constant   Progression:  Unchanged Severity:  Moderate Onset quality:  Sudden Duration:  1 day Timing:  Constant Progression:  Unchanged Chronicity:  Chronic Relieved by:  Nothing Worsened by:  Nothing tried Ineffective treatments:  None tried Associated symptoms: no fever, no headaches, no joint pain, no myalgias, no nausea, no shortness of breath, not vomiting and not wheezing    24 yo F with a chief complaint of a rash. Patient has a history of psoriasis feels that this same. Patient has run out of her Derma-Smoothe at home. Came here for prescription. Denies any fevers or chills. Denies any recent injuries or burns.  Past Medical History  Diagnosis Date  . Eczema, allergic    History reviewed. No pertinent past surgical history. Family History  Problem Relation Age of Onset  . Diabetes Other   . Hypertension Other    Social History  Substance Use Topics  . Smoking status: Current Some Day Smoker -- 0.50 packs/day    Types: Cigarettes  . Smokeless tobacco: None  . Alcohol Use: No   OB History    No data available     Review of Systems  Constitutional: Negative for fever and chills.  HENT: Negative for congestion and rhinorrhea.   Eyes: Negative for redness and visual disturbance.  Respiratory: Negative for shortness of breath and wheezing.   Cardiovascular: Negative for  chest pain and palpitations.  Gastrointestinal: Negative for nausea and vomiting.  Genitourinary: Negative for dysuria and urgency.  Musculoskeletal: Negative for myalgias and arthralgias.  Skin: Positive for rash. Negative for pallor and wound.  Neurological: Negative for dizziness and headaches.      Allergies  Review of patient's allergies indicates no known allergies.  Home Medications   Prior to Admission medications   Medication Sig Start Date End Date Taking? Authorizing Provider  Fluocinolone Acetonide (DERMA-SMOOTHE/FS SCALP) 0.01 % OIL Apply 1 each topically Nightly. Apply nightly, cover with shower cap, leave on overnight for at least 4 hours 03/08/15   Melene Plan, DO  triamcinolone (KENALOG) 0.025 % cream Apply 1 application topically 2 (two) times daily. 03/08/15   Melene Plan, DO   BP 133/98 mmHg  Pulse 76  Temp(Src) 97.8 F (36.6 C) (Oral)  Resp 16  SpO2 100%  LMP 02/22/2015 Physical Exam  Constitutional: She is oriented to person, place, and time. She appears well-developed and well-nourished. No distress.  HENT:  Head: Normocephalic and atraumatic.    Eyes: EOM are normal. Pupils are equal, round, and reactive to light.  Neck: Normal range of motion. Neck supple.  Cardiovascular: Normal rate and regular rhythm.  Exam reveals no gallop and no friction rub.   No murmur heard. Pulmonary/Chest: Effort normal. She has no wheezes. She has no rales.  Abdominal: Soft. She exhibits no distension. There is no tenderness. There is no rebound and no guarding.  Musculoskeletal: She exhibits no edema or tenderness.  Neurological: She is alert and oriented to person, place, and time.  Skin: Skin is warm and dry. She is not diaphoretic.  Psychiatric: She has a normal mood and affect. Her behavior is normal.    ED Course  Procedures (including critical care time) Labs Review Labs Reviewed - No data to display  Imaging Review No results found. I have personally reviewed  and evaluated these images and lab results as part of my medical decision-making.   EKG Interpretation None      MDM   Final diagnoses:  Psoriasis    24 yo F with a history of psoriasis who comes in for a prescription refill. We'll send her home with her home prescriptions encouraged follow-up with her dermatologist.   7:24 AM:  I have discussed the diagnosis/risks/treatment options with the patient and believe the pt to be eligible for discharge home to follow-up with Derm. We also discussed returning to the ED immediately if new or worsening sx occur. We discussed the sx which are most concerning (e.g., fever, chills, blistering) that necessitate immediate return. Medications administered to the patient during their visit and any new prescriptions provided to the patient are listed below.  Medications given during this visit Medications - No data to display  New Prescriptions   FLUOCINOLONE ACETONIDE (DERMA-SMOOTHE/FS SCALP) 0.01 % OIL    Apply 1 each topically Nightly. Apply nightly, cover with shower cap, leave on overnight for at least 4 hours   TRIAMCINOLONE (KENALOG) 0.025 % CREAM    Apply 1 application topically 2 (two) times daily.     The patient appears reasonably screen and/or stabilized for discharge and I doubt any other medical condition or other [facility] requiring further screening, evaluation, or treatment in the ED at this time prior to discharge.      Melene Plan, DO 03/08/15 [phone]

## 2015-03-08 NOTE — ED Notes (Signed)
The pt is c/o  A rash on her face for 2-3 weeks. Scabbing and weeping.  lmp  The first of the month

## 2015-03-08 NOTE — ED Notes (Signed)
Pt reports hx of psoriasis, states today's rash is similar to the same. States she was itching and scratching at it tonight at work. States burning as well.

## 2015-03-08 NOTE — Discharge Instructions (Signed)
Psoriasis Psoriasis is a common, long-lasting (chronic) inflammation of the skin. It affects both men and women equally, of all ages and all races. Psoriasis cannot be passed from person to person (not contagious). Psoriasis varies from mild to very severe. When severe, it can greatly affect your quality of life. Psoriasis is an inflammatory disorder affecting the skin as well as other organs including the joints (causing an arthritis). With psoriasis, the skin sheds its top layer of cells more rapidly than it does in someone without psoriasis. CAUSES  The cause of psoriasis is largely unknown. Genetics, your immune system, and the environment seem to play a role in causing psoriasis. Factors that can make psoriasis worse include:  Damage or trauma to the skin, such as cuts, scrapes, and sunburn. This damage often causes new areas of psoriasis (lesions).  Winter dryness and lack of sunlight.  Medicines such as lithium, beta-blockers, antimalarial drugs, ACE inhibitors, nonsteroidal anti-inflammatory drugs (ibuprofen, aspirin), and terbinafine. Let your caregiver know if you are taking any of these drugs.  Alcohol. Excessive alcohol use should be avoided if you have psoriasis. Drinking large amounts of alcohol can affect:  How well your psoriasis treatment works.  How safe your psoriasis treatment is.  Smoking. If you smoke, ask your caregiver for help to quit.  Stress.  Bacterial or viral infections.  Arthritis. Arthritis associated with psoriasis (psoriatic arthritis) affects less than 10% of patients with psoriasis. The arthritic intensity does not always match the skin psoriasis intensity. It is important to let your caregiver know if your joints hurt or if they are stiff. SYMPTOMS  The most common form of psoriasis begins with little red bumps that gradually become larger. The bumps begin to form scales that flake off easily. The lower layers of scales stick together. When these scales  are scratched or removed, the underlying skin is tender and bleeds easily. These areas then grow in size and may become large. Psoriasis often creates a rash that looks the same on both sides of the body (symmetrical). It often affects the elbows, knees, groin, genitals, arms, legs, scalp, and nails. Affected nails often have pitting, loosen, thicken, crumble, and are difficult to treat.  "Inverse psoriasis"occurs in the armpits, under breasts, in skin folds, and around the groin, buttocks, and genitals.  "Guttate psoriasis" generally occurs in children and young adults following a recent sore throat (strep throat). It begins with many small, red, scaly spots on the skin. It clears spontaneously in weeks or a few months without treatment. DIAGNOSIS  Psoriasis is diagnosed by physical exam. A tissue sample (biopsy) may also be taken. TREATMENT The treatment of psoriasis depends on your age, health, and living conditions.  Steroid (cortisone) creams, lotions, and ointments may be used. These treatments are associated with thinning of the skin, blood vessels that get larger (dilated), loss of skin pigmentation, and easy bruising. It is important to use these steroids as directed by your caregiver. Only treat the affected areas and not the normal, unaffected skin. People on long-term steroid treatment should wear a medical alert bracelet. Injections may be used in areas that are difficult to treat.  Scalp treatments are available as shampoos, solutions, sprays, foams, and oils. Avoid scratching the scalp and picking at the scales.  Anthralin medicine works well on areas that are difficult to treat. However, it stains clothes and skin and may cause temporary irritation.  Synthetic vitamin D (calcipotriene)can be used on small areas. It is available by prescription. The forms   of synthetic vitamin D available in health food stores do not help with psoriasis.  Coal tarsare available in various strengths  for psoriasis that is difficult to treat. They are one of the longest used treatments for difficult to treat psoriasis. However, they are messy to use.  Light therapy (UV therapy) can be carefully and professionally monitored in a dermatologist's office. Careful sunbathing is helpful for many people as directed by your caregiver. The exposure should be just long enough to cause a mild redness (erythema) of your skin. Avoid sunburn as this may make the condition worse. Sunscreen (SPF of 30 or higher) should be used to protect against sunburn. Cataracts, wrinkles, and skin aging are some of the harmful side effects of light therapy.  If creams (topical medicines) fail, there are several other options for systemic or oral medicines your caregiver can suggest. Psoriasis can sometimes be very difficult to treat. It can come and go. It is necessary to follow up with your caregiver regularly if your psoriasis is difficult to treat. Usually, with persistence you can get a good amount of relief. Maintaining consistent care is important. Do not change caregivers just because you do not see immediate results. It may take several trials to find the right combination of treatment for you. PREVENTING FLARE-UPS  Wear gloves while you wash dishes, while cleaning, and when you are outside in the cold.  If you have radiators, place a bowl of water or damp towel on the radiator. This will help put water back in the air. You can also use a humidifier to keep the air moist. Try to keep the humidity at about 60% in your home.  Apply moisturizer while your skin is still damp from bathing or showering. This traps water in the skin.  Avoid long, hot baths or showers. Keep soap use to a minimum. Soaps dry out the skin and wash away the protective oils. Use a fragrance free, dye free soap.  Drink enough water and fluids to keep your urine clear or pale yellow. Not drinking enough water depletes your skin's water  supply.  Turn off the heat at night and keep it low during the day. Cool air is less drying. SEEK MEDICAL CARE IF:  You have increasing pain in the affected areas.  You have uncontrolled bleeding in the affected areas.  You have increasing redness or warmth in the affected areas.  You start to have pain or stiffness in your joints.  You start feeling depressed about your condition.  You have a fever. Document Released: 06/03/2000 Document Revised: 08/29/2011 Document Reviewed: 11/29/2010 [employer] Patient Information 2015 [employer]. This information is not intended to replace advice given to you by your health care provider. Make sure you discuss any questions you have with your health care provider.  

## 2015-03-09 ENCOUNTER — Telehealth (HOSPITAL_COMMUNITY): Payer: Self-pay

## 2015-03-09 NOTE — Telephone Encounter (Signed)
Call from pt regarding Rx for 0.025% Triamcinolone Cream Rx.  Per pt uses Triamcinolone  0.1% Cream per dermatologist recommendation.  Chart reviewed by Angelique Holm PA and verified through [facility] pt has previously been prescribed the Triamcinolone  0.1% Cream.  OK per Angelique Holm PA to call in "Triamcinolone  (Kenalog) 0.1% Cream, apply 1 application topically 2 times daily, #30 grams" Pt advised and Rx called and left on VM at [facility] [phone].

## 2015-04-26 ENCOUNTER — Encounter (HOSPITAL_COMMUNITY): Payer: Self-pay | Admitting: *Deleted

## 2015-04-26 ENCOUNTER — Emergency Department (HOSPITAL_COMMUNITY)
Admission: EM | Admit: 2015-04-26 | Discharge: 2015-04-26 | Disposition: A | Discharge status: Home / Self Care | Payer: Self-pay | Attending: Emergency Medicine | Admitting: Emergency Medicine

## 2015-04-26 DIAGNOSIS — M67431 Ganglion, right wrist: Secondary | ICD-10-CM | POA: Insufficient documentation

## 2015-04-26 DIAGNOSIS — Z720 Tobacco use: Secondary | ICD-10-CM | POA: Insufficient documentation

## 2015-04-26 DIAGNOSIS — M25531 Pain in right wrist: Secondary | ICD-10-CM

## 2015-04-26 DIAGNOSIS — Z872 Personal history of diseases of the skin and subcutaneous tissue: Secondary | ICD-10-CM | POA: Insufficient documentation

## 2015-04-26 DIAGNOSIS — Z7952 Long term (current) use of systemic steroids: Secondary | ICD-10-CM | POA: Insufficient documentation

## 2015-04-26 MED ORDER — IBUPROFEN 800 MG PO TABS: 800.0000 mg | ORAL_TABLET | Freq: Three times a day (TID) | ORAL | Status: DC

## 2015-04-26 NOTE — ED Provider Notes (Signed)
CSN: [id number]     Arrival date & time 04/26/15  [id number] History  By signing my name below, I, Octavia Heirrianna Nassar, attest that this documentation has been prepared under the direction and in the presence of Fayrene HelperBowie Mayia Megill, PA-C. Electronically Signed: Octavia HeirArianna Nassar, ED Scribe. 04/26/2015. 10:53 AM.    Chief Complaint  Patient presents with  . Wrist Pain      The history is provided by the patient. No language interpreter was used.   HPI Comments: Janice Esparza is a 24 y.o. female who presents to the Emergency Department complaining of constant, gradual worsening lateral right wrist pain onset yesterday. Pt reports she was able to move her wrist yesterday despite pain and states this morning upon awakening she was unable to flex and extend her wrist secondary to pain. Pt has had a ganglion cyst on her right wrist for the past 4 years and notes it has not given her pain. She denies injury to her wrist.  Past Medical History  Diagnosis Date  . Eczema, allergic    No past surgical history on file. Family History  Problem Relation Age of Onset  . Diabetes Other   . Hypertension Other    Social History  Substance Use Topics  . Smoking status: Current Some Day Smoker -- 0.50 packs/day    Types: Cigarettes  . Smokeless tobacco: Not on file  . Alcohol Use: No   OB History    No data available     Review of Systems  Musculoskeletal: Positive for arthralgias.  Skin: Negative for rash.      Allergies  Review of patient's allergies indicates no known allergies.  Home Medications   Prior to Admission medications   Medication Sig Start Date End Date Taking? Authorizing Provider  Fluocinolone Acetonide (DERMA-SMOOTHE/FS SCALP) 0.01 % OIL Apply 1 each topically Nightly. Apply nightly, cover with shower cap, leave on overnight for at least 4 hours 03/08/15   Melene Planan Floyd, DO  triamcinolone (KENALOG) 0.025 % cream Apply 1 application topically 2 (two) times daily. 03/08/15   Melene Planan Floyd, DO    Triage vitals: BP 129/89 mmHg  Temp(Src) 98.7 F (37.1 C) (Oral)  Resp 16  Ht 5\' 6"  (1.676 m)  Wt 104 lb (47.174 kg)  BMI 16.79 kg/m2  SpO2 100%  LMP 04/23/2015 Physical Exam  Constitutional: She appears well-developed and well-nourished. No distress.  HENT:  Head: Normocephalic and atraumatic.  Eyes: Right eye exhibits no discharge. Left eye exhibits no discharge.  Pulmonary/Chest: Effort normal. No respiratory distress.  Musculoskeletal:  right wrist ganglian cyst dorsum of wrist 2 cm of diameter non tender to palpation, Pain noted to ulnar aspect of wrist with tenderness to palpation, Decreased wrist flexion secondary to pain, decreased wrist supination and pronation due to pain, radial pulses 2+, normal grip strength, no warmth, no signs of infection  Neurological: She is alert. Coordination normal.  Skin: No rash noted. She is not diaphoretic.  Psychiatric: She has a normal mood and affect. Her behavior is normal.  Nursing note and vitals reviewed.   ED Course  Procedures  DIAGNOSTIC STUDIES: Oxygen Saturation is 100% on RA, normal by my interpretation.  COORDINATION OF CARE:  10:46 AM Pt has a large ganglion cyst on R wrist.  It does not appear infected.  She has non traumatic wrist pain.  She is NVI.  Suspect ganglion cyst as causative.   Discussed treatment plan which includes right wrist split with pt at bedside and pt agreed to  plan.  Referral to surgery was given.     MDM   Final diagnoses:  Right wrist pain  Ganglion cyst of wrist, right   BP 129/89 mmHg  Temp(Src) 98.7 F (37.1 C) (Oral)  Resp 16  Ht  (1.676 m)  Wt 104 lb (47.174 kg)  BMI 16.79 kg/m2  SpO2 100%  LMP 04/23/2015  I personally performed the services described in this documentation, which was scribed in my presence. The recorded information has been reviewed and is accurate.     Fayrene Helper, PA-C 04/26/15 1057  Eber Hong, MD 04/26/15 2023

## 2015-04-26 NOTE — Discharge Instructions (Signed)
Please wear wrist splint for support.  Follow up with a surgeon for further evaluation of your ganglion cyst, as it may need to be surgically removed.    Ganglion Cyst A ganglion cyst is a noncancerous, fluid-filled lump that occurs near joints or tendons. The ganglion cyst grows out of a joint or the lining of a tendon. It most often develops in the hand or wrist, but it can also develop in the shoulder, elbow, hip, knee, ankle, or foot. The round or oval ganglion cyst can be the size of a pea or larger than a grape. Increased activity may enlarge the size of the cyst because more fluid starts to build up.  CAUSES It is not known what causes a ganglion cyst to grow. However, it may be related to:  Inflammation or irritation around the joint.  An injury.  Repetitive movements or overuse.  Arthritis. RISK FACTORS Risk factors include:  Being a woman.  Being age 26-50. SIGNS AND SYMPTOMS Symptoms may include:   A lump. This most often appears on the hand or wrist, but it can occur in other areas of the body.  Tingling.  Pain.  Numbness.  Muscle weakness.  Weak grip.  Less movement in a joint. DIAGNOSIS Ganglion cysts are most often diagnosed based on a physical exam. Your health care provider will feel the lump and may shine a light alongside it. If it is a ganglion cyst, a light often shines through it. Your health care provider may order an X-ray, ultrasound, or MRI to rule out other conditions. TREATMENT Ganglion cysts usually go away on their own without treatment. If pain or other symptoms are involved, treatment may be needed. Treatment is also needed if the ganglion cyst limits your movement or if it gets infected. Treatment may include:  Wearing a brace or splint on your wrist or finger.  Taking anti-inflammatory medicine.  Draining fluid from the lump with a needle (aspiration).  Injecting a steroid into the joint.  Surgery to remove the ganglion cyst. HOME  CARE INSTRUCTIONS  Do not press on the ganglion cyst, poke it with a needle, or hit it.  Take medicines only as directed by your health care provider.  Wear your brace or splint as directed by your health care provider.  Watch your ganglion cyst for any changes.  Keep all follow-up visits as directed by your health care provider. This is important. SEEK MEDICAL CARE IF:  Your ganglion cyst becomes larger or more painful.  You have increased redness, red streaks, or swelling.  You have pus coming from the lump.  You have weakness or numbness in the affected area.  You have a fever or chills.   This information is not intended to replace advice given to you by your health care provider. Make sure you discuss any questions you have with your health care provider.   Document Released: 06/03/2000 Document Revised: 06/27/2014 Document Reviewed: 11/19/2013 Elsevier Interactive Patient Education [employer]2016 Elsevier Inc.

## 2015-04-26 NOTE — Care Management Note (Addendum)
Case Management Note  Patient Details  Name: Talbert NanVinnie E Boak MRN: [id number] Date of Birth: 12/26/1990  Subjective/Objective:  24 y.o. F seen in the ED today for Ganglion Cyst of Right Wrist. Referred to  CM as she has no PCP and has been seen in the ED for multiple complaints throughout 2016. Pleasant female who is more than willling to explore PCP options in the [place] area. CM spoke [place] who [place].  CM discussed and provided written information for self pay pcps, discussed the importance of pcp vs EDP services for f/u care,  www.goodrx.com, discounted pharmacies and other [place] resources such as [facility] , affordable care act,  [place] med assist, financial assistance, self pay dental services, [place] med assist, DSS and  health department  Reviewed resources for [place] self [place] pcps like [facility], family medicine at [address], [facility], [facility], local urgent care centers, [facility], [facility] family [facility], general medical clinics, family services of the [place], [facility] urgent care plus others, medication resources, [facility] out patient pharmacies and housing Pt voiced understanding and appreciation of resources provided                     Action/Plan: [facility]. ; as CM exited room pt asked for "note for work". 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 Referred her to MD/RN. No further CM needs at present.    Expected Discharge Date:                  Expected Discharge Plan:  Home/Self Care  In-House Referral:     Discharge planning Services  CM Consult ([facility])  Post Acute Care Choice:    Choice offered to:  Patient  DME Arranged:    DME Agency:     HH Arranged:    HH Agency:     Status of Service:  In process, will continue to follow  Medicare Important Message Given:    Date Medicare IM Given:    Medicare IM give by:    Date Additional  Medicare IM Given:    Additional Medicare Important Message give by:     If discussed at Long Length of Stay Meetings, dates discussed:    Additional Comments: CM will [facility] 04/27/2015 and make appt for pt as she will need PCP.   CrutchfieldDerrill Memo, Nino Amano M, RN 04/26/2015, 11:50 AM

## 2015-04-26 NOTE — ED Notes (Signed)
Declined W/C at D/C and was escorted to lobby by RN. 

## 2015-04-26 NOTE — ED Notes (Signed)
Pt reports pain to RT wrist and limited movement to RT wrist. Pt has an existing cyst to posterior Rt hand. Pt denies any injury to  RT wrist.

## 2015-04-27 ENCOUNTER — Telehealth: Payer: Self-pay | Admitting: *Deleted

## 2015-04-27 NOTE — Telephone Encounter (Signed)
Darral Rishel J. Lucretia RoersWood, RN, BSN, [place] [phone] NCM set up appointment with Julianne HandlerLachina Hollis, NP on 11/21 @ 1045 in [facility].  Spoke with pt via telephone; gave appointment time, address and telephone number.  Pt verbalizes understanding of keeping appointment.

## 2015-05-11 ENCOUNTER — Ambulatory Visit: Payer: Self-pay | Admitting: Family Medicine

## 2017-11-01 ENCOUNTER — Ambulatory Visit: Payer: Self-pay | Admitting: Podiatry

## 2018-06-27 ENCOUNTER — Other Ambulatory Visit: Payer: Self-pay

## 2018-06-27 ENCOUNTER — Emergency Department (HOSPITAL_COMMUNITY)
Admission: EM | Admit: 2018-06-27 | Discharge: 2018-06-27 | Disposition: A | Discharge status: Home / Self Care | Payer: Self-pay | Attending: Emergency Medicine | Admitting: Emergency Medicine

## 2018-06-27 ENCOUNTER — Encounter (HOSPITAL_COMMUNITY): Payer: Self-pay | Admitting: Emergency Medicine

## 2018-06-27 DIAGNOSIS — F17210 Nicotine dependence, cigarettes, uncomplicated: Secondary | ICD-10-CM | POA: Insufficient documentation

## 2018-06-27 DIAGNOSIS — N3000 Acute cystitis without hematuria: Secondary | ICD-10-CM | POA: Insufficient documentation

## 2018-06-27 DIAGNOSIS — R109 Unspecified abdominal pain: Secondary | ICD-10-CM

## 2018-06-27 LAB — COMPREHENSIVE METABOLIC PANEL
ALT: 15 U/L (ref 0–44)
ANION GAP: 5 (ref 5–15)
AST: 22 U/L (ref 15–41)
Albumin: 3.7 g/dL (ref 3.5–5.0)
Alkaline Phosphatase: 55 U/L (ref 38–126)
BUN: 11 mg/dL (ref 6–20)
CALCIUM: 8.3 mg/dL — AB (ref 8.9–10.3)
CO2: 23 mmol/L (ref 22–32)
Chloride: 110 mmol/L (ref 98–111)
Creatinine, Ser: 0.6 mg/dL (ref 0.44–1.00)
GFR calc non Af Amer: >60 mL/min (ref >60)
GLUCOSE: 83 mg/dL (ref 70–99)
Potassium: 3.6 mmol/L (ref 3.5–5.1)
SODIUM: 138 mmol/L (ref 135–145)
TOTAL PROTEIN: 7.3 g/dL (ref 6.5–8.1)
Total Bilirubin: 0.6 mg/dL (ref 0.3–1.2)

## 2018-06-27 LAB — URINALYSIS, ROUTINE W REFLEX MICROSCOPIC
BILIRUBIN URINE: NEGATIVE
Glucose, UA: NEGATIVE mg/dL
Hgb urine dipstick: NEGATIVE
Ketones, ur: NEGATIVE mg/dL
Leukocytes, UA: NEGATIVE
Nitrite: POSITIVE — AB
Protein, ur: NEGATIVE mg/dL
Specific Gravity, Urine: 1.016 (ref 1.005–1.030)
pH: 7 (ref 5.0–8.0)

## 2018-06-27 LAB — CBC
HEMATOCRIT: 39.7 % (ref 36.0–46.0)
Hemoglobin: 13 g/dL (ref 12.0–15.0)
MCH: 30.7 pg (ref 26.0–34.0)
MCHC: 32.7 g/dL (ref 30.0–36.0)
MCV: 93.9 fL (ref 80.0–100.0)
Platelets: 182 10*3/uL (ref 150–400)
RBC: 4.23 MIL/uL (ref 3.87–5.11)
RDW: 12 % (ref 11.5–15.5)
WBC: 6.9 10*3/uL (ref 4.0–10.5)
nRBC: 0 % (ref 0.0–0.2)

## 2018-06-27 LAB — LIPASE, BLOOD: LIPASE: 21 U/L (ref 11–51)

## 2018-06-27 LAB — PREGNANCY, URINE: Preg Test, Ur: NEGATIVE

## 2018-06-27 MED ORDER — SODIUM CHLORIDE 0.9 % IV BOLUS
1000.0000 mL | Freq: Once | INTRAVENOUS | Status: AC
  Administered 2018-06-27: 1000 mL via INTRAVENOUS

## 2018-06-27 MED ORDER — ONDANSETRON HCL 4 MG/2ML IJ SOLN
4.0000 mg | Freq: Once | INTRAMUSCULAR | Status: AC
  Administered 2018-06-27: 4 mg via INTRAVENOUS
  Filled 2018-06-27: qty 2

## 2018-06-27 MED ORDER — ONDANSETRON 8 MG PO TBDP: 8.0000 mg | ORAL_TABLET | Freq: Three times a day (TID) | ORAL | 0 refills | Status: DC | PRN

## 2018-06-27 MED ORDER — IBUPROFEN 600 MG PO TABS: 600.0000 mg | ORAL_TABLET | Freq: Three times a day (TID) | ORAL | 0 refills | Status: DC | PRN

## 2018-06-27 MED ORDER — CEPHALEXIN 500 MG PO CAPS: 500.0000 mg | ORAL_CAPSULE | Freq: Two times a day (BID) | ORAL | 0 refills | Status: DC

## 2018-06-27 MED ORDER — CEPHALEXIN 250 MG PO CAPS
500.0000 mg | ORAL_CAPSULE | Freq: Once | ORAL | Status: AC
  Administered 2018-06-27: 500 mg via ORAL
  Filled 2018-06-27: qty 2

## 2018-06-27 MED ORDER — MORPHINE SULFATE (PF) 4 MG/ML IV SOLN
4.0000 mg | Freq: Once | INTRAVENOUS | Status: AC
  Administered 2018-06-27: 4 mg via INTRAVENOUS
  Filled 2018-06-27: qty 1

## 2018-06-27 NOTE — ED Provider Notes (Signed)
[facility] Provider Note   CSN: [id number] Arrival date & time: 06/27/18  [id number]     History   Chief Complaint Chief Complaint  Patient presents with  . Abdominal Pain  . Emesis    HPI Janice Esparza is a 28 y.o. female.  HPI 28 year old female presents emergency department complaints of generalized abdominal pain as well as nausea and vomiting today.  No diarrhea.  She reports some urinary frequency without dysuria.  No flank pain.  No fevers or chills.  Symptoms are constant and moderate in severity.  No other complaints.  No chest pain or shortness of breath.   Past Medical History:  Diagnosis Date  . Eczema, allergic     Patient Active Problem List   Diagnosis Date Noted  . 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 TOBACCO USER 03/07/2009  . BACTERIAL VAGINITIS 02/03/2009  . NAUSEA AND VOMITING 02/03/2009  . RHINITIS, ALLERGIC 08/17/2006  . ECZEMA, ATOPIC DERMATITIS 08/17/2006    History reviewed. No pertinent surgical history.   OB History   No obstetric history on file.      Home Medications    Prior to Admission medications   Medication Sig Start Date End Date Taking? Authorizing Provider  Fluocinolone Acetonide (DERMA-SMOOTHE/FS SCALP) 0.01 % OIL Apply 1 each topically Nightly. Apply nightly, cover with shower cap, leave on overnight for at least 4 hours Patient not taking: Reported on 06/27/2018 03/08/15   Melene PlanFloyd, Dan, DO  ibuprofen (ADVIL,MOTRIN) 800 MG tablet Take 1 tablet (800 mg total) by mouth 3 (three) times daily. Patient not taking: Reported on 06/27/2018 04/26/15   Fayrene Helperran, Bowie, PA-C  triamcinolone (KENALOG) 0.025 % cream Apply 1 application topically 2 (two) times daily. Patient not taking: Reported on 06/27/2018 03/08/15   Melene PlanFloyd, Dan, DO    Family History Family History  Problem Relation Age of Onset  . Diabetes Other   . Hypertension Other     Social History Social History   Tobacco Use  . Smoking status:  Current Some Day Smoker    Packs/day: 0.50    Types: Cigarettes  Substance Use Topics  . Alcohol use: No  . Drug use: No     Allergies   Patient has no known allergies.   Review of Systems Review of Systems  All other systems reviewed and are negative.    Physical Exam Updated Vital Signs BP (!) 142/101 (BP Location: Right Arm)   Pulse 99   Temp 98.6 F (37 C) (Oral)   Resp 20   Ht 5\' 6"  (1.676 m)   Wt 49.9 kg   LMP 06/19/2018 (Exact Date)   SpO2 99%   BMI 17.75 kg/m   Physical Exam Vitals signs and nursing note reviewed.  Constitutional:      General: She is not in acute distress.    Appearance: She is well-developed.  HENT:     Head: Normocephalic and atraumatic.  Neck:     Musculoskeletal: Normal range of motion.  Cardiovascular:     Rate and Rhythm: Normal rate and regular rhythm.     Heart sounds: Normal heart sounds.  Pulmonary:     Effort: Pulmonary effort is normal.     Breath sounds: Normal breath sounds.  Abdominal:     General: There is no distension.     Palpations: Abdomen is soft.     Tenderness: There is no abdominal tenderness.  Musculoskeletal: Normal range of motion.  Skin:    General: Skin is  warm and dry.  Neurological:     Mental Status: She is alert and oriented to person, place, and time.  Psychiatric:        Judgment: Judgment normal.      ED Treatments / Results  Labs (all labs ordered are listed, but only abnormal results are displayed) Labs Reviewed  URINALYSIS, ROUTINE W REFLEX MICROSCOPIC - Abnormal; Notable for the following components:      Result Value   Nitrite POSITIVE (*)    Bacteria, UA MANY (*)    All other components within normal limits  CBC  PREGNANCY, URINE  COMPREHENSIVE METABOLIC PANEL  LIPASE, BLOOD    EKG None  Radiology No results found.  Procedures Procedures (including critical care time)  Medications Ordered in ED Medications  cephALEXin (KEFLEX) capsule 500 mg (has no  administration in time range)  sodium chloride 0.9 % bolus 1,000 mL (0 mLs Intravenous Stopped 06/27/18 1048)  ondansetron (ZOFRAN) injection 4 mg (4 mg Intravenous Given 06/27/18 0929)  morphine 4 MG/ML injection 4 mg (4 mg Intravenous Given 06/27/18 0929)     Initial Impression / Assessment and Plan / ED Course  I have reviewed the triage vital signs and the nursing notes.  Pertinent labs & imaging results that were available during my care of the patient were reviewed by me and considered in my medical decision making (see chart for details).     Feels much better after symptomatic treatment here in the emergency department.  Acute cystitis.  Vomiting controlled.  Keeping fluids down.  Urine culture sent.  Home on Keflex.  Repeat exam without focal tenderness.  No indication for advanced imaging.  Close primary care follow-up.  She understands return to the ER for new or worsening symptoms  Final Clinical Impressions(s) / ED Diagnoses   Final diagnoses:  Acute abdominal pain  Acute cystitis without hematuria    ED Discharge Orders    None       Azalia Bilisampos, Riel Hirschman, MD 06/27/18 1240

## 2018-06-27 NOTE — ED Triage Notes (Signed)
Pt arrives to ED from home with complaints of epigastric pain starting this morning. Pt reports four episodes of vomiting. Pt placed in position of comfort with bed locked and lowered, call bell in reach.

## 2018-06-27 NOTE — ED Notes (Signed)
Patient verbalizes understanding of discharge instructions. Opportunity for questioning and answers were provided. Armband removed by staff, pt discharged from ED ambulatory.   

## 2018-09-21 ENCOUNTER — Other Ambulatory Visit: Payer: Self-pay

## 2018-09-21 ENCOUNTER — Emergency Department (HOSPITAL_COMMUNITY): Payer: Self-pay

## 2018-09-21 ENCOUNTER — Emergency Department (HOSPITAL_COMMUNITY)
Admission: EM | Admit: 2018-09-21 | Discharge: 2018-09-21 | Disposition: A | Discharge status: Home / Self Care | Payer: Self-pay | Attending: Emergency Medicine | Admitting: Emergency Medicine

## 2018-09-21 DIAGNOSIS — J36 Peritonsillar abscess: Secondary | ICD-10-CM | POA: Insufficient documentation

## 2018-09-21 LAB — CBC WITH DIFFERENTIAL/PLATELET
Abs Immature Granulocytes: 0.06 10*3/uL (ref 0.00–0.07)
Basophils Absolute: 0 10*3/uL (ref 0.0–0.1)
Basophils Relative: 0 %
Eosinophils Absolute: 0 10*3/uL (ref 0.0–0.5)
Eosinophils Relative: 0 %
HCT: 39.1 % (ref 36.0–46.0)
Hemoglobin: 12.8 g/dL (ref 12.0–15.0)
Immature Granulocytes: 1 %
Lymphocytes Relative: 11 %
Lymphs Abs: 1.4 10*3/uL (ref 0.7–4.0)
MCH: 29.8 pg (ref 26.0–34.0)
MCHC: 32.7 g/dL (ref 30.0–36.0)
MCV: 90.9 fL (ref 80.0–100.0)
Monocytes Absolute: 0.9 10*3/uL (ref 0.1–1.0)
Monocytes Relative: 7 %
Neutro Abs: 10 10*3/uL — ABNORMAL HIGH (ref 1.7–7.7)
Neutrophils Relative %: 81 %
Platelets: 204 10*3/uL (ref 150–400)
RBC: 4.3 MIL/uL (ref 3.87–5.11)
RDW: 11.9 % (ref 11.5–15.5)
WBC: 12.4 10*3/uL — ABNORMAL HIGH (ref 4.0–10.5)
nRBC: 0 % (ref 0.0–0.2)

## 2018-09-21 LAB — BASIC METABOLIC PANEL
Anion gap: 9 (ref 5–15)
BUN: 11 mg/dL (ref 6–20)
CO2: 23 mmol/L (ref 22–32)
Calcium: 9.3 mg/dL (ref 8.9–10.3)
Chloride: 105 mmol/L (ref 98–111)
Creatinine, Ser: 0.73 mg/dL (ref 0.44–1.00)
GFR calc Af Amer: >60 mL/min (ref >60)
GFR calc non Af Amer: >60 mL/min (ref >60)
Glucose, Bld: 112 mg/dL — ABNORMAL HIGH (ref 70–99)
Potassium: 3 mmol/L — ABNORMAL LOW (ref 3.5–5.1)
Sodium: 137 mmol/L (ref 135–145)

## 2018-09-21 LAB — GROUP A STREP BY PCR: Group A Strep by PCR: NOT DETECTED

## 2018-09-21 LAB — I-STAT BETA HCG BLOOD, ED (MC, WL, AP ONLY): I-stat hCG, quantitative: 6.4 m[IU]/mL — ABNORMAL HIGH (ref <5)

## 2018-09-21 LAB — HCG, QUANTITATIVE, PREGNANCY: hCG, Beta Chain, Quant, S: 2 m[IU]/mL (ref <5)

## 2018-09-21 MED ORDER — LIDOCAINE VISCOUS HCL 2 % MT SOLN
15.0000 mL | Freq: Once | OROMUCOSAL | Status: AC
  Administered 2018-09-21: 14:00:00 15 mL via OROMUCOSAL
  Filled 2018-09-21: qty 15

## 2018-09-21 MED ORDER — DEXAMETHASONE SODIUM PHOSPHATE 10 MG/ML IJ SOLN
10.0000 mg | Freq: Once | INTRAMUSCULAR | Status: AC
  Administered 2018-09-21: 15:00:00 10 mg via INTRAVENOUS
  Filled 2018-09-21: qty 1

## 2018-09-21 MED ORDER — HYDROCODONE-ACETAMINOPHEN 5-325 MG PO TABS
1.0000 | ORAL_TABLET | Freq: Once | ORAL | Status: AC
  Administered 2018-09-21: 14:00:00 1 via ORAL
  Filled 2018-09-21: qty 1

## 2018-09-21 MED ORDER — SODIUM CHLORIDE 0.9 % IV SOLN
INTRAVENOUS | Status: DC | PRN
  Administered 2018-09-21: 15:00:00 1000 mL via INTRAVENOUS

## 2018-09-21 MED ORDER — CLINDAMYCIN HCL 300 MG PO CAPS: 300.0000 mg | ORAL_CAPSULE | Freq: Three times a day (TID) | ORAL | 0 refills | Status: DC

## 2018-09-21 MED ORDER — IOHEXOL 300 MG/ML  SOLN
75.0000 mL | Freq: Once | INTRAMUSCULAR | Status: AC | PRN
  Administered 2018-09-21: 16:00:00 75 mL via INTRAVENOUS

## 2018-09-21 MED ORDER — CLINDAMYCIN PHOSPHATE 600 MG/50ML IV SOLN
600.0000 mg | Freq: Once | INTRAVENOUS | Status: AC
  Administered 2018-09-21: 15:00:00 600 mg via INTRAVENOUS
  Filled 2018-09-21: qty 50

## 2018-09-21 MED ORDER — SODIUM CHLORIDE 0.9 % IV BOLUS
1000.0000 mL | Freq: Once | INTRAVENOUS | Status: AC
  Administered 2018-09-21: 14:00:00 1000 mL via INTRAVENOUS

## 2018-09-21 NOTE — ED Provider Notes (Signed)
[facility] EMERGENCY DEPARTMENT Provider Note   CSN: [id number] Arrival date & time: 09/21/18  1328  History   Chief Complaint Chief Complaint  Patient presents with  . Sore Throat  . Vomiting   HPI Janice Esparza is a 28 y.o. female with past medical history significant for eczema who presents for evaluation of sore throat.  Patient states she has had sore throat x3 days.  Patient states that she thought she was better appetite this morning when she developed worsening left sided sore throat as well as multiple episodes of emesis.  Emesis is blood-tinged.  Patient denies fever, chills, nausea, neck pain, neck stiffness, congestion, rhinorrhea, cough, chest pain, shortness of breath, abdominal pain, diarrhea, dysuria, voice change.  No drooling, dysphasia, trismus.  States she has been able to tolerate p.o. intake at home without difficulty.  She rates her current pain a 10/10.  Pain is located to the left side of her throat.  Denies radiation of pain. States she does have history of PTA and this feels similar. Denies hx of esophageal varices, alcohol use. Patient states she could possible be pregnant as she does not use protection and is sexually active.  History obtained from patient.  No interpreter was used.   HPI  Past Medical History:  Diagnosis Date  . Eczema, allergic     Patient Active Problem List   Diagnosis Date Noted  . 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 TOBACCO USER 03/07/2009  . BACTERIAL VAGINITIS 02/03/2009  . NAUSEA AND VOMITING 02/03/2009  . RHINITIS, ALLERGIC 08/17/2006  . ECZEMA, ATOPIC DERMATITIS 08/17/2006    No past surgical history on file.   OB History   No obstetric history on file.    Home Medications    Prior to Admission medications   Medication Sig Start Date End Date Taking? Authorizing Provider  cephALEXin (KEFLEX) 500 MG capsule Take 1 capsule (500 mg total) by mouth 2 (two) times daily. 06/27/18   Azalia Bilis, MD   ibuprofen (ADVIL,MOTRIN) 600 MG tablet Take 1 tablet (600 mg total) by mouth every 8 (eight) hours as needed. 06/27/18   Azalia Bilis, MD  ondansetron (ZOFRAN ODT) 8 MG disintegrating tablet Take 1 tablet (8 mg total) by mouth every 8 (eight) hours as needed for nausea or vomiting. 06/27/18   Azalia Bilis, MD    Family History Family History  Problem Relation Age of Onset  . Diabetes Other   . Hypertension Other     Social History Social History   Tobacco Use  . Smoking status: Current Some Day Smoker    Packs/day: 0.50    Types: Cigarettes  Substance Use Topics  . Alcohol use: No  . Drug use: No     Allergies   Patient has no known allergies.   Review of Systems Review of Systems  Constitutional: Negative.   HENT: Positive for sore throat and trouble swallowing.        Blood tinged sputum  Eyes: Negative.   Respiratory: Negative.   Cardiovascular: Negative.   Gastrointestinal: Negative.   Endocrine: Negative.   Genitourinary: Negative.   Musculoskeletal: Negative.   Skin: Negative.   Neurological: Negative.   All other systems reviewed and are negative.    Physical Exam Updated Vital Signs BP (!) 148/112 (BP Location: Right Arm)   Pulse (!) 112   Temp 99.1 F (37.3 C) (Oral)   Resp 20   SpO2 98%   Physical Exam Vitals signs and nursing note reviewed.  Constitutional:      General: She is not in acute distress.    Appearance: She is well-developed. She is not ill-appearing, toxic-appearing or diaphoretic.     Comments: Patient pacing around room on initial evaluation stating "this shit hurts, you need to hurry up."  HENT:     Head: Atraumatic.     Jaw: There is normal jaw occlusion.     Comments: No drooling, dysphasia or trismus.  No facial swelling.    Nose:     Comments: No congestion or rhinorrhea to bilateral nares.    Mouth/Throat:     Comments: Posterior oropharynx erythematous.  Asymmetric oral edema.  Left tonsil 2+, right 1+.  Mild exudate  bilateral tonsils.  Uvula midline without deviation.  Sublingual area soft.  Mucous membranes moist.  Moderate pooling of secretions. 2 finger mouth opening. No submandibular swelling. Hot potato voice. Eyes:     Pupils: Pupils are equal, round, and reactive to light.  Neck:     Musculoskeletal: Full passive range of motion without pain and normal range of motion.     Comments: No neck stiffness or neck rigidity.  Phonation normal.  No cervical lymphadenopathy. Cardiovascular:     Rate and Rhythm: Normal rate.     Pulses: Normal pulses.     Heart sounds: Normal heart sounds.  Pulmonary:     Effort: No respiratory distress.     Comments: Clear to auscultation bilaterally without wheeze, rhonchi or rales.  No accessory muscle usage. Abdominal:     General: There is no distension.     Comments: Soft, Nontender without rebound or guarding.  Musculoskeletal: Normal range of motion.  Skin:    General: Skin is warm and dry.  Neurological:     Mental Status: She is alert.    ED Treatments / Results  Labs (all labs ordered are listed, but only abnormal results are displayed) Labs Reviewed  CBC WITH DIFFERENTIAL/PLATELET - Abnormal; Notable for the following components:      Result Value   WBC 12.4 (*)    Neutro Abs 10.0 (*)    All other components within normal limits  BASIC METABOLIC PANEL - Abnormal; Notable for the following components:   Potassium 3.0 (*)    Glucose, Bld 112 (*)    All other components within normal limits  I-STAT BETA HCG BLOOD, ED ([facility], [facility], [facility] ONLY) - Abnormal; Notable for the following components:   I-stat hCG, quantitative 6.4 (*)    All other components within normal limits  GROUP A STREP BY PCR  HCG, QUANTITATIVE, PREGNANCY    EKG None  Radiology No results found.  Procedures Procedures (including critical care time)  Medications Ordered in ED Medications  clindamycin (CLEOCIN) IVPB 600 mg (600 mg Intravenous New Bag/Given 09/21/18 1516)  0.9 %   sodium chloride infusion (1,000 mLs Intravenous New Bag/Given 09/21/18 1514)  sodium chloride 0.9 % bolus 1,000 mL (0 mLs Intravenous Stopped 09/21/18 1513)  HYDROcodone-acetaminophen (NORCO/VICODIN) 5-325 MG per tablet 1 tablet (1 tablet Oral Given 09/21/18 1413)  lidocaine (XYLOCAINE) 2 % viscous mouth solution 15 mL (15 mLs Mouth/Throat Given 09/21/18 1414)  dexamethasone (DECADRON) injection 10 mg (10 mg Intravenous Given 09/21/18 1511)  iohexol (OMNIPAQUE) 300 MG/ML solution 75 mL (75 mLs Intravenous Contrast Given 09/21/18 1533)    Initial Impression / Assessment and Plan / ED Course  I have reviewed the triage vital signs and the nursing notes.  Pertinent labs & imaging results that were available during  my care of the patient were reviewed by me and considered in my medical decision making (see chart for details).  28 year old female appears otherwise well presents for evaluation of sore throat and blood-tinged emesis.  Afebrile, nonseptic, non-ill-appearing.  Patient with erythematous posterior oropharynx.  Asymmetric oral edema, however uvula is midline without deviation. Patent airway. Moderate oral secretions in OP.  Slight hot potato voice. No drooling or tripoding. Lungs clear without stridor, wheezing or rales.  No neck stiffness or neck rigidity.  No meningismus.  No facial swelling. She does not appear in any acute respiratory distress.  Will obtain labs, CT soft tissue neck to r/o PTA, RPA, pregnancy test given possible pregnancy, give fluids given tachycardia and reevaluate. Tachycardia from agitation vs infection vs dehydration? Will plan for recheck. She does not appear septic at this time. Concern for PTA.  Strep negative, I-stat hcg 6.4, will obtain hcg quant, CBC with leukocytosis at 12.4. Metabolic panel with mild hypokalemia. Given possible pregnancy will hold on Toradol for pain control. Will give Decadron and 1 dose of Clindamycin. I have personally talked with pharmacy and she states  these medication are ok if quant positive.  1500: Patient re-evalauted. Patent airway. No stridor. Low suspicion for epiglottitis. Tolerating secretions however OP with persistent moderate secretions. Patient feels improvement in dysphagia with PO meds. HR 82 in room. No acute respiratory distress noted.  CT scan pending at care transfer.  Likely anticipate consult with ENT for FU.  Patient care transferred to [facility] who will determine ultimate treatment, plan and disposition.     Final Clinical Impressions(s) / ED Diagnoses   Final diagnoses:  None    ED Discharge Orders    None       Henderly, Britni A, PA-C 09/21/18 1539    Benjiman Core, MD 09/21/18 1816

## 2018-09-21 NOTE — ED Notes (Signed)
Dr. Doran Heater at the bedside for abscess drain

## 2018-09-21 NOTE — Discharge Instructions (Addendum)
RINSE INSTRUCTIONS °· Irrigate your mouth with the following mixture 3 times daily for the next 5 days: °· 1/2 cup of warm water °· 1 teaspoon of salt °· 1/2 teaspoon of honey ° °Eat a soft diet for the next few days. No hard/crunchy foods or foods with sharp edges.  °Your pain should continue to improve over the next couple of days.  °Take tylenol and/or ibuprofen as needed for pain. Follow the package directions.  °Take a probiotic, such as yogurt with active cultures, while taking your antibiotic. Take your antibiotic until the treatment is complete. Do not stop early.  ° °Return to the ER if you experience any difficulty breathing or a significant bleeding event. Call the clinic if you have other concerns.  ° °

## 2018-09-21 NOTE — ED Provider Notes (Signed)
Received signout at the beginning of shift.  Patient here with sore throat ongoing for nearly a week.  CT scan today demonstrate evidence of tonsillar/peritonsillar abscess measuring up to 3.7 cm with surrounding phlegmonous inflammation.  Evidence of mass-effect upon the pharynx and hypopharynx, displaced toward the right and narrowed.  I did consult with on-call ENT specialist Dr. Encarnacion Slates who have review the CT result and will perform I&D in the ER.  Patient have received Decadron and clindamycin while waiting.  She felt better.  6:16 PM ENT specialist has seen and evaluated pt and performed I&D of PTA.  Pt d/c home with antibiotic and close f/u.  Pt tolerates well.   BP (!) 158/107 (BP Location: Right Arm) Comment: Simultaneous filing. User may not have seen previous data.  Pulse 86 Comment: Simultaneous filing. User may not have seen previous data.  Temp 98.9 F (37.2 C) (Oral)   Resp 16   SpO2 100% Comment: Simultaneous filing. User may not have seen previous data.  Results for orders placed or performed during the hospital encounter of 09/21/18  Group A Strep by PCR  Result Value Ref Range   Group A Strep by PCR NOT DETECTED NOT DETECTED  CBC with Differential  Result Value Ref Range   WBC 12.4 (H) 4.0 - 10.5 K/uL   RBC 4.30 3.87 - 5.11 MIL/uL   Hemoglobin 12.8 12.0 - 15.0 g/dL   HCT 19.3 79.0 - 24.0 %   MCV 90.9 80.0 - 100.0 fL   MCH 29.8 26.0 - 34.0 pg   MCHC 32.7 30.0 - 36.0 g/dL   RDW 97.3 53.2 - 99.2 %   Platelets 204 150 - 400 K/uL   nRBC 0.0 0.0 - 0.2 %   Neutrophils Relative % 81 %   Neutro Abs 10.0 (H) 1.7 - 7.7 K/uL   Lymphocytes Relative 11 %   Lymphs Abs 1.4 0.7 - 4.0 K/uL   Monocytes Relative 7 %   Monocytes Absolute 0.9 0.1 - 1.0 K/uL   Eosinophils Relative 0 %   Eosinophils Absolute 0.0 0.0 - 0.5 K/uL   Basophils Relative 0 %   Basophils Absolute 0.0 0.0 - 0.1 K/uL   Immature Granulocytes 1 %   Abs Immature Granulocytes 0.06 0.00 - 0.07 K/uL  Basic  metabolic panel  Result Value Ref Range   Sodium 137 135 - 145 mmol/L   Potassium 3.0 (L) 3.5 - 5.1 mmol/L   Chloride 105 98 - 111 mmol/L   CO2 23 22 - 32 mmol/L   Glucose, Bld 112 (H) 70 - 99 mg/dL   BUN 11 6 - 20 mg/dL   Creatinine, Ser 4.26 0.44 - 1.00 mg/dL   Calcium 9.3 8.9 - 83.4 mg/dL   GFR calc non Af Amer >60 >60 mL/min   GFR calc Af Amer >60 >60 mL/min   Anion gap 9 5 - 15  hCG, quantitative, pregnancy  Result Value Ref Range   hCG, Beta Chain, Quant, S 2 <5 mIU/mL  I-Stat Beta hCG blood, ED ([facility], [facility], [facility] only)  Result Value Ref Range   I-stat hCG, quantitative 6.4 (H) <5 mIU/mL   Comment 3           Ct Soft Tissue Neck W Contrast  Result Date: 09/21/2018 CLINICAL DATA:  Sore throat and vomiting over the last 5 days. EXAM: CT NECK WITH CONTRAST TECHNIQUE: Multidetector CT imaging of the neck was performed using the standard protocol following the bolus administration of intravenous contrast. CONTRAST:  67mL OMNIPAQUE IOHEXOL 300 MG/ML  SOLN COMPARISON:  02/18/2014 FINDINGS: Pharynx and larynx: There is evidence of widespread pharyngitis. There is advanced tonsillitis on the left with tonsillar and peritonsillar abscess formation measuring up to 3.7 cm. Edematous inflammatory changes present within the parapharyngeal space. This includes retropharyngeal edema without frank retropharyngeal abscess being visible. Pharynx and hypopharynx are displaced towards the right and narrowed. Salivary glands: Parotid and submandibular glands are normal. Thyroid: Newly seen 7 mm left thyroid nodule. Thyroid ultrasound recommended electively after treatment. Lymph nodes: Reactive lymph nodes on the left but without suppuration. Vascular: Negative Limited intracranial: Negative Visualized orbits: Negative Mastoids and visualized paranasal sinuses: Clear Skeleton: Negative Upper chest: Normal Other: None IMPRESSION: Pharyngitis and tonsillitis. Advanced inflammation in the left tonsillar region with  tonsillar/peritonsillar abscess measuring up to 3.7 cm in surrounding phlegmonous inflammation. Edematous inflammatory change within the parapharyngeal space. Mass-effect upon the pharynx and hypopharynx, displaced towards the right and narrowed. Electronically Signed   By: Paulina Fusi M.D.   On: 09/21/2018 16:04      Fayrene Helper, PA-C 09/21/18 1817    Benjiman Core, MD 09/21/18 [phone]

## 2018-09-21 NOTE — ED Triage Notes (Signed)
C/o sore throat and vomitting since Saturday, no fever, SOB or cough. No travel or COVID contact

## 2018-09-21 NOTE — Consult Note (Signed)
OTOLARYNGOLOGY CONSULTATION   Primary Care Physician: Patient, No Pcp Per Patient Location at Initial Consult: Emergency Department Chief Complaint/Reason for Consult: left PTA  History of Presenting Illness:  History obtained from the patient Janice Esparza is a  28 y.o. female presenting with  Left sided peritonsillar abscess.  She began experiencing left-sided throat pain 5 days ago.  This has slowly worsened over the course of the week.  She has not been on antibiotics.  She did have a prior peritonsillar abscess a few years ago.  No difficulty breathing.  Did have some mild trismus on presentation to the emergency department.  She has received a dose of clindamycin and Decadron.  She has begun to have a little bit improvement in her trismus.  She denies shortness of breath.  She is tolerating her secretions and able to keep herself hydrated.  She does smoke a few cigarettes a day.  Past Medical History:  Diagnosis Date  . Eczema, allergic     No past surgical history on file.  Family History  Problem Relation Age of Onset  . Diabetes Other   . Hypertension Other     Social History   Socioeconomic History  . Marital status: Single    Spouse name: Not on file  . Number of children: Not on file  . Years of education: Not on file  . Highest education level: Not on file  Occupational History  . Not on file  Social Needs  . Financial resource strain: Not on file  . Food insecurity:    Worry: Not on file    Inability: Not on file  . Transportation needs:    Medical: Not on file    Non-medical: Not on file  Tobacco Use  . Smoking status: Current Some Day Smoker    Packs/day: 0.50    Types: Cigarettes  Substance and Sexual Activity  . Alcohol use: No  . Drug use: No  . Sexual activity: Yes    Birth control/protection: None  Lifestyle  . Physical activity:    Days per week: Not on file    Minutes per session: Not on file  . Stress: Not on file  Relationships  .  Social connections:    Talks on phone: Not on file    Gets together: Not on file    Attends religious service: Not on file    Active member of club or organization: Not on file    Attends meetings of clubs or organizations: Not on file    Relationship status: Not on file  Other Topics Concern  . Not on file  Social History Narrative  . Not on file    No current facility-administered medications on file prior to encounter.    Current Outpatient Medications on File Prior to Encounter  Medication Sig Dispense Refill  . cephALEXin (KEFLEX) 500 MG capsule Take 1 capsule (500 mg total) by mouth 2 (two) times daily. 14 capsule 0  . ibuprofen (ADVIL,MOTRIN) 600 MG tablet Take 1 tablet (600 mg total) by mouth every 8 (eight) hours as needed. 15 tablet 0  . ondansetron (ZOFRAN ODT) 8 MG disintegrating tablet Take 1 tablet (8 mg total) by mouth every 8 (eight) hours as needed for nausea or vomiting. 10 tablet 0    No Known Allergies   Review of Systems: ROS complete and negative except for the above-mentioned  OBJECTIVE: Vital Signs: Vitals:   09/21/18 1551 09/21/18 1552  BP: (!) 147/114 (!) 158/107  Pulse:  90 86  Resp:  16  Temp:  98.9 F (37.2 C)  SpO2: 99% 100%    I&O  Intake/Output Summary (Last 24 hours) at 09/21/2018 1654 Last data filed at 09/21/2018 1513 Gross per 24 hour  Intake 1000 ml  Output -  Net 1000 ml    Physical Exam General: Well developed, well nourished. No acute distress. Voice mildly muffled  Head/Face: Normocephalic, atraumatic. No scars or lesions. No sinus tenderness. Facial nerve intact and equal bilaterally. No facial lacerations. Salivary glands non tender and without palpable masses  Eyes: Globes well positioned, no proptosis Lids: No periorbital edema/ecchymosis. No lid laceration Conjunctiva: No chemosis, hemorrhage EOMI  Ears: No gross deformity. Normal external canal. Tympanic membrane intact bilaterally  Hearing:  Normal speech reception.   Nose: No gross deformity or lesions. No purulent discharge. Septum midline. No turbinate hypertrophy.  Mouth/Oropharynx: Lips without any lesions. Dentition good.  The left soft palate is edematous and mildly fluctuant on palpation.  There is uvular deviation to the right.  The right tonsil is normal.  The left tonsil is very erythematous and edematous.  There is palpable fluctuance.  Moderate trismus.  Neck: Trachea midline. No masses. No thyromegaly or nodules palpated. No crepitus.  Lymphatic:  Mild shotty left-sided cervical adenopathy, feels reactive  Respiratory: No stridor or distress.  Cardiovascular: Regular rate and rhythm.  Extremities: No edema or cyanosis. Warm and well-perfused.  Skin: No scars or lesions on face or neck.  Neurologic: CN II-XII intact. Moving all extremities without gross abnormality.  Other:      Labs: Lab Results  Component Value Date   WBC 12.4 (H) 09/21/2018   HGB 12.8 09/21/2018   HCT 39.1 09/21/2018   PLT 204 09/21/2018   ALT 15 06/27/2018   AST 22 06/27/2018   NA 137 09/21/2018   K 3.0 (L) 09/21/2018   CL 105 09/21/2018   CREATININE 0.73 09/21/2018   BUN 11 09/21/2018   CO2 23 09/21/2018     Review of Ancillary Data / Diagnostic Tests: CT neck personally reviewed.  There is an ill-defined somewhat rim-enhancing abscess in the left peritonsillar space.  Procedure: Incision and drainage of left peritonsillar abscess Findings: The left peritonsillar cavity was clearly identified with the hemostat.  There was some dishwater debris as well as a small amount of purulence which was identified.  Hemostat really fell into the abscess cavity.  A wound culture was taken. Details: The left peritonsillar space was anesthetized with Cetacaine spray followed by 1% lidocaine with 1-100,000 epinephrine.  A 15 blade was used to make an incision in the peritonsillar mucosa/anterior pillar.  A hemostat was then utilized to enter the peritonsillar space.  All  loculations were then opened.  A wound culture was obtained.  The wound bed was copiously irrigated with saline.  All instrumentation was removed.  The patient experienced immediate improvement in her trismus.  The patient tolerated the procedure well and there were no complications.  ASSESSMENT:  28 y.o. female with left peritonsillar abscess status post incision and drainage.  RECOMMENDATIONS: Clindamycin x1 week 3 times daily mouth rinses Follow-up in clinic in 1 week. We will follow wound culture. Return precautions advised and care instructions provided to the patient.    Misty Stanley, MD  [facility] Office phone [phone]

## 2018-09-23 LAB — AEROBIC CULTURE W GRAM STAIN (SUPERFICIAL SPECIMEN): Culture: NORMAL

## 2018-09-24 ENCOUNTER — Telehealth: Payer: Self-pay

## 2018-09-24 NOTE — Telephone Encounter (Signed)
Post ED Visit - Positive Culture Follow-up  Culture report reviewed by antimicrobial stewardship pharmacist: [facility] []  Enzo Bi, Pharm.D. []  Celedonio Miyamoto, Pharm.D., BCPS AQ-ID []  Garvin Fila, Pharm.D., BCPS []  Georgina Pillion, [address].D., BCPS []  [place], [place].D., BCPS, AAHIVP []  Estella Husk, Pharm.D., BCPS, AAHIVP []  Lysle Pearl, PharmD, BCPS []  Phillips Climes, PharmD, BCPS []  Agapito Games, PharmD, BCPS []  Verlan Friends, PharmD []  Mervyn Gay, PharmD, BCPS []  Khelani Level, PharmD [facility] []  Len Childs, PharmD []  Greer Pickerel, PharmD []  Adalberto Cole, PharmD []  Perlie Gold, Rph []  Lonell Face) Jean Rosenthal, PharmD []  Earl Many, PharmD []  Junita Push, PharmD []  Dorna Leitz, PharmD []  Terrilee Files, PharmD []  Lynann Beaver, PharmD []  Keturah Barre, PharmD []  Loralee Pacas, PharmD []  Bernadene Person, PharmD   Positive aerobic culture Treated with Clindamycin, organism sensitive to the same and no further patient follow-up is required at this time.  Jerry Caras 09/24/2018, 8:40 AM

## 2018-10-08 ENCOUNTER — Other Ambulatory Visit: Payer: Self-pay

## 2018-10-08 ENCOUNTER — Emergency Department (HOSPITAL_COMMUNITY): Payer: Self-pay

## 2018-10-08 ENCOUNTER — Encounter (HOSPITAL_COMMUNITY): Payer: Self-pay | Admitting: *Deleted

## 2018-10-08 ENCOUNTER — Emergency Department (HOSPITAL_COMMUNITY)
Admission: EM | Admit: 2018-10-08 | Discharge: 2018-10-08 | Disposition: A | Discharge status: Home / Self Care | Payer: Self-pay | Attending: Emergency Medicine | Admitting: Emergency Medicine

## 2018-10-08 DIAGNOSIS — L0291 Cutaneous abscess, unspecified: Secondary | ICD-10-CM | POA: Insufficient documentation

## 2018-10-08 DIAGNOSIS — F17210 Nicotine dependence, cigarettes, uncomplicated: Secondary | ICD-10-CM | POA: Insufficient documentation

## 2018-10-08 DIAGNOSIS — J36 Peritonsillar abscess: Secondary | ICD-10-CM | POA: Insufficient documentation

## 2018-10-08 LAB — COMPREHENSIVE METABOLIC PANEL
ALT: 17 U/L (ref 0–44)
AST: 23 U/L (ref 15–41)
Albumin: 3.7 g/dL (ref 3.5–5.0)
Alkaline Phosphatase: 62 U/L (ref 38–126)
Anion gap: 7 (ref 5–15)
BUN: 13 mg/dL (ref 6–20)
CO2: 23 mmol/L (ref 22–32)
Calcium: 9.1 mg/dL (ref 8.9–10.3)
Chloride: 104 mmol/L (ref 98–111)
Creatinine, Ser: 0.54 mg/dL (ref 0.44–1.00)
GFR calc Af Amer: >60 mL/min (ref >60)
GFR calc non Af Amer: >60 mL/min (ref >60)
Glucose, Bld: 81 mg/dL (ref 70–99)
Potassium: 3.8 mmol/L (ref 3.5–5.1)
Sodium: 134 mmol/L — ABNORMAL LOW (ref 135–145)
Total Bilirubin: 0.5 mg/dL (ref 0.3–1.2)
Total Protein: 9.6 g/dL — ABNORMAL HIGH (ref 6.5–8.1)

## 2018-10-08 LAB — CBC WITH DIFFERENTIAL/PLATELET
Abs Immature Granulocytes: 0.01 10*3/uL (ref 0.00–0.07)
Basophils Absolute: 0 10*3/uL (ref 0.0–0.1)
Basophils Relative: 0 %
Eosinophils Absolute: 0 10*3/uL (ref 0.0–0.5)
Eosinophils Relative: 0 %
HCT: 36.1 % (ref 36.0–46.0)
Hemoglobin: 11.5 g/dL — ABNORMAL LOW (ref 12.0–15.0)
Immature Granulocytes: 0 %
Lymphocytes Relative: 18 %
Lymphs Abs: 1.2 10*3/uL (ref 0.7–4.0)
MCH: 29.8 pg (ref 26.0–34.0)
MCHC: 31.9 g/dL (ref 30.0–36.0)
MCV: 93.5 fL (ref 80.0–100.0)
Monocytes Absolute: 0.5 10*3/uL (ref 0.1–1.0)
Monocytes Relative: 7 %
Neutro Abs: 5 10*3/uL (ref 1.7–7.7)
Neutrophils Relative %: 75 %
Platelets: 235 10*3/uL (ref 150–400)
RBC: 3.86 MIL/uL — ABNORMAL LOW (ref 3.87–5.11)
RDW: 13.2 % (ref 11.5–15.5)
WBC: 6.7 10*3/uL (ref 4.0–10.5)
nRBC: 0 % (ref 0.0–0.2)

## 2018-10-08 LAB — POC URINE PREG, ED: Preg Test, Ur: NEGATIVE

## 2018-10-08 LAB — GROUP A STREP BY PCR: Group A Strep by PCR: NOT DETECTED

## 2018-10-08 MED ORDER — CLINDAMYCIN PHOSPHATE 600 MG/50ML IV SOLN
600.0000 mg | Freq: Once | INTRAVENOUS | Status: AC
  Administered 2018-10-08: 600 mg via INTRAVENOUS
  Filled 2018-10-08: qty 50

## 2018-10-08 MED ORDER — LIDOCAINE VISCOUS HCL 2 % MT SOLN
15.0000 mL | Freq: Once | OROMUCOSAL | Status: DC
  Filled 2018-10-08: qty 15

## 2018-10-08 MED ORDER — CLINDAMYCIN HCL 300 MG PO CAPS: 300.0000 mg | ORAL_CAPSULE | Freq: Three times a day (TID) | ORAL | 0 refills | Status: AC

## 2018-10-08 MED ORDER — KETOROLAC TROMETHAMINE 15 MG/ML IJ SOLN
15.0000 mg | Freq: Once | INTRAMUSCULAR | Status: AC
  Administered 2018-10-08: 14:00:00 15 mg via INTRAVENOUS
  Filled 2018-10-08: qty 1

## 2018-10-08 MED ORDER — IOHEXOL 300 MG/ML  SOLN
75.0000 mL | Freq: Once | INTRAMUSCULAR | Status: AC | PRN
  Administered 2018-10-08: 14:00:00 75 mL via INTRAVENOUS

## 2018-10-08 MED ORDER — DEXAMETHASONE SODIUM PHOSPHATE 10 MG/ML IJ SOLN
10.0000 mg | Freq: Once | INTRAMUSCULAR | Status: AC
  Administered 2018-10-08: 15:00:00 10 mg via INTRAVENOUS
  Filled 2018-10-08: qty 1

## 2018-10-08 NOTE — Discharge Instructions (Signed)
Your CT scan did show evidence of 2 microabscesses.  These may be residual old abscesses versus new abscesses forming.  I have given you IV antibiotics in the department as well as discharging you home with antibiotics.  Please call to schedule an appointment with Dr. Doran Heater, the ear nose and throat physician for follow-up.  If you develop worsening symptoms please seek reevaluation in the ED.

## 2018-10-08 NOTE — ED Triage Notes (Signed)
Pt last seen 4-1 with peri-tonsill abscess. Pt has completed all Ati-bx and has started to have same type of throat pain.

## 2018-10-08 NOTE — ED Provider Notes (Signed)
[facility] EMERGENCY DEPARTMENT Provider Note   CSN: [id number] Arrival date & time: 10/08/18  1232  History   Chief Complaint Chief Complaint  Patient presents with   Sore Throat    HPI Janice Esparza is a 28 y.o. female with past medical history significant for peritonsillar abscess on left who presents for evaluation of sore throat.  Patient states she has had a sore throat x2 days. Throat pain is located to the left side of her throat, location of her left peritonsillar abscess.  Was seen emergency department 09/19/2018 and had drainage performed by Dr. Merril Abbe from ENT.  She was given IV antibiotics and DC'd home with clindamycin.  Patient states her pain resolved, however began again yesterday. States she has tried calling her ENT office was unable to get through to them. She rates her current pain a 7/10.  Does not radiate.  Has not taken anything for pain PTA.  Patient is concerned that she has the beginning stages of a peritonsillar abscess.  Denies any additional aggravating or alleviating factors. Denies fever, chills, nausea, vomiting, neck stiffness, neck rigidity, chest pain, shortness of breath, drooling, dysphasia, trismus.  She has been able to tolerate p.o. intake at home without difficulty.  History obtained from patient.  No interpreter was used.     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 No pertinent surgical history.   OB History   No obstetric history on file.      Home Medications    Prior to Admission medications   Medication Sig Start Date End Date Taking? Authorizing Provider  cephALEXin (KEFLEX) 500 MG capsule Take 1 capsule  (500 mg total) by mouth 2 (two) times daily. 06/27/18   Azalia Bilis, MD  clindamycin (CLEOCIN) 300 MG capsule Take 1 capsule (300 mg total) by mouth 3 (three) times daily for 7 days. 10/08/18 10/15/18  Eithel Ryall A, PA-C  ibuprofen (ADVIL,MOTRIN) 600 MG tablet Take 1 tablet (600 mg total) by mouth every 8 (eight) hours as needed. 06/27/18   Azalia Bilis, MD  ondansetron (ZOFRAN ODT) 8 MG disintegrating tablet Take 1 tablet (8 mg total) by mouth every 8 (eight) hours as needed for nausea or vomiting. 06/27/18   Azalia Bilis, MD    Family History Family History  Problem Relation Age of Onset   Diabetes Other    Hypertension Other     Social History Social History   Tobacco Use   Smoking status: Current Some Day Smoker    Packs/day: 0.50    Types: Cigarettes   Smokeless tobacco: Never Used  Substance Use Topics   Alcohol use: No   Drug use: No     Allergies   Patient has no known allergies.   Review of Systems Review of Systems  Constitutional: Negative.   HENT: Positive for sore throat. Negative for congestion, dental problem, drooling, ear pain, facial swelling, nosebleeds, postnasal drip, rhinorrhea, sinus pain, tinnitus, trouble swallowing and voice change.   Respiratory: Negative.   Cardiovascular: Negative.   Gastrointestinal: Negative.   Genitourinary: Negative.   Musculoskeletal: Negative.   Skin: Negative.   Neurological: Negative.   All other systems reviewed and  are negative.    Physical Exam Updated Vital Signs BP 114/72    Pulse 73    Temp 99 F (37.2 C) (Oral)    Resp 16    Ht 5\' 6"  (1.676 m)    Wt 52.2 kg    LMP 09/13/2018    SpO2 100%    BMI 18.56 kg/m   Physical Exam Vitals signs and nursing note reviewed.  Constitutional:      General: She is not in acute distress.    Appearance: She is not ill-appearing, toxic-appearing or diaphoretic.  HENT:     Head: Normocephalic and atraumatic.     Jaw: There is normal jaw occlusion.     Right  Ear: Tympanic membrane, ear canal and external ear normal. There is no impacted cerumen. No hemotympanum. Tympanic membrane is not injected, scarred, perforated, erythematous, retracted or bulging.     Left Ear: Tympanic membrane, ear canal and external ear normal. There is no impacted cerumen. No hemotympanum. Tympanic membrane is not injected, scarred, perforated, erythematous, retracted or bulging.     Ears:     Comments: No Mastoid tenderness.    Nose:     Comments: Clear rhinorrhea and congestion to bilateral nares.  No sinus tenderness.    Mouth/Throat:     Comments: Posterior oropharynx with mild erythema.  Uvula with deviation.  1+ tonsillar edema to left.  No evidence of exudate.  Uvula without swelling.  No drroling, dysphagia or trismus. Phonation is normal.  No pooling of secretions. Sublingual area soft. No submandibular swelling. No facial swelling. Neck:     Musculoskeletal: Full passive range of motion without pain and normal range of motion.     Trachea: Trachea and phonation normal.     Meningeal: Brudzinski's sign and Kernig's sign absent.      Comments: No Neck stiffness or neck rigidity.  No meningismus.  Mild non-tender shotty left cervical lymphadenopathy. Cardiovascular:     Comments: No murmurs rubs or gallops. Pulmonary:     Comments: Clear to auscultation bilaterally without wheeze, rhonchi or rales.  No accessory muscle usage.  Able speak in full sentences. Abdominal:     Comments: Soft, nontender without rebound or guarding.  No CVA tenderness.  Musculoskeletal:     Comments: Moves all 4 extremities without difficulty.  Lower extremities without edema, erythema or warmth.  Skin:    Comments: Brisk capillary refill.  No rashes or lesions.  Neurological:     Mental Status: She is alert.     Comments: Ambulatory in department without difficulty.  Cranial nerves II through XII grossly intact.  No facial droop.  No aphasia.    ED Treatments / Results  Labs (all  labs ordered are listed, but only abnormal results are displayed) Labs Reviewed  CBC WITH DIFFERENTIAL/PLATELET - Abnormal; Notable for the following components:      Result Value   RBC 3.86 (*)    Hemoglobin 11.5 (*)    All other components within normal limits  COMPREHENSIVE METABOLIC PANEL - Abnormal; Notable for the following components:   Sodium 134 (*)    Total Protein 9.6 (*)    All other components within normal limits  GROUP A STREP BY PCR  POC URINE PREG, ED    EKG None  Radiology Ct Soft Tissue Neck W Contrast  Result Date: 10/08/2018 CLINICAL DATA:  Recent peritonsillar abscess. Continued symptoms. EXAM: CT NECK WITH CONTRAST TECHNIQUE: Multidetector CT imaging of the neck was performed using the standard  protocol following the bolus administration of intravenous contrast. CONTRAST:  75mL OMNIPAQUE IOHEXOL 300 MG/ML  SOLN COMPARISON:  CT neck 09/21/2018. FINDINGS: Pharynx and larynx: Continued LEFT tonsillar and peritonsillar inflammation, but with less mass effect compared with priors. Overall phlegmon measures approximately 1.5 x 2.5 cm on image 36. There is a small LEFT tonsillar microabscess measuring 4 x 4 mm and a small LEFT peritonsillar microabscess measuring 4 x 7 mm. No similar inflammation on the RIGHT. Normal larynx. Airway patent. Salivary glands: No inflammation, mass, or stone. Thyroid: Normal. Lymph nodes: Regional lymphadenopathy on the LEFT is reactive, but improved from priors. Up to 12 mm short axis LEFT level II lymph node. Vascular: Negative. Limited intracranial: Negative. Visualized orbits: Negative. Mastoids and visualized paranasal sinuses: Clear. Skeleton: No acute or aggressive process. Upper chest: Negative. Other: None. IMPRESSION: Continued LEFT tonsillar and peritonsillar inflammation, but overall improved, with less mass effect compared with 09/21/2018, most consistent with phlegmon; approximate cross-section 1.5 x 2.5 cm. Small LEFT tonsillar  microabscess and 4 x 7 mm LEFT peritonsillar microabscess. Reactive LEFT-sided lymphadenopathy. Electronically Signed   By: Elsie Stain M.D.   On: 10/08/2018 14:59    Procedures Procedures (including critical care time)  Medications Ordered in ED Medications  lidocaine (XYLOCAINE) 2 % viscous mouth solution 15 mL (15 mLs Mouth/Throat Refused 10/08/18 1447)  clindamycin (CLEOCIN) IVPB 600 mg (0 mg Intravenous Stopped 10/08/18 1447)  ketorolac (TORADOL) 15 MG/ML injection 15 mg (15 mg Intravenous Given 10/08/18 1358)  iohexol (OMNIPAQUE) 300 MG/ML solution 75 mL (75 mLs Intravenous Contrast Given 10/08/18 1429)  dexamethasone (DECADRON) injection 10 mg (10 mg Intravenous Given 10/08/18 1450)   Initial Impression / Assessment and Plan / ED Course  I have reviewed the triage vital signs and the nursing notes.  Pertinent labs & imaging results that were available during my care of the patient were reviewed by me and considered in my medical decision making (see chart for details).  28 year old female appears otherwise well presents for evaluation of sore throat and left-sided neck pain.  Afebrile, nonseptic, non-ill-appearing.  I actually evaluated patient on her last ED visit on 09/19/2018.  At that time she had a left peritonsillar abscess which required drainage by Dr. Doran Heater, ENT in the emergency department. She was given IV antibiotics and DC'd home with clindamycin at that time. Patient states her symptoms resolved, however re-started yesterday. Patient is concerned she has recurrent peritonsillar abscess.  She has left-sided throat pain which is rated a 7/10. Has not taken anything for it. On exam she has an erythematous posterior oropharynx.  She has 1+ tonsillar edema to the left. Uvula with deviation to left without swelling. No exudate. No drooling, dysphagia or trismus. No neck stiffness or neck rigidity.  She does have nontender shotty lymph nodes to left cervical region. She has been able  to tolerate p.o. intake at home without difficulty. She has no stridor.  Lungs clear. Sublingual area soft. No submandibular swelling. Low suspicion for Ludwig's angina. Given history with deviation of uvula with mild left-sided tonsillar swelling and left neck pain will obtain CT scan, basic labs as well as give IV clindamycin and reevaluate.  Initial BP elevated, however on my evaluation and recheck BP-- blood pressure 114/72.  On eevaluation patient states she has contacted ENT office. Has follow up appointment on Wednesday. Pregnancy negative. CBC without leukocytosis, hemoglobin at 11.5, previous 12.8. Metabolic panel with mild hyponatremia at 134, no additional electrolyte, renal or liver abnormality, strep negative.  CT pending. Tolerating oral secretions on reevaluation. No evidence of SIRS or sepsis.  CT scan with tonsillar and peritonsillar microabscess as well as phlegmon.  Patient airway.  Patient able to tolerate p.o. intake in department without difficulty.  She has no airway involvement.  Speaks without difficulty.  Evaluation she does not have any pooling of secretions.  No phonation changes.  Patient has been given IV Decadron, Toradol as well as clindamycin.  Patient hemodynamically stable and appropriate for DC home this time.  She does have follow-up appointment with ENT on Wednesday.  Discussed with patient to keep this appointment.  Discussed strict return precautions which would cause patient to return to emergency department.  Patient voiced understanding and is agreeable for follow-up.     Final Clinical Impressions(s) / ED Diagnoses   Final diagnoses:  Peritonsillar abscess  Phlegmon    ED Discharge Orders         Ordered    clindamycin (CLEOCIN) 300 MG capsule  3 times daily     10/08/18 1509           Clorinda Wyble A, PA-C 10/08/18 1518    Geoffery Lyons, MD 10/08/18 1820

## 2018-10-08 NOTE — ED Notes (Signed)
Patient verbalizes understanding of discharge instructions . Opportunity for questions and answers were provided . Armband removed by staff ,Pt discharged from ED. W/C  offered at D/C  and Declined W/C at D/C and was escorted to lobby by RN.  

## 2019-07-17 ENCOUNTER — Other Ambulatory Visit: Payer: Self-pay

## 2019-07-17 ENCOUNTER — Encounter (HOSPITAL_COMMUNITY): Payer: Self-pay

## 2019-07-17 ENCOUNTER — Inpatient Hospital Stay (HOSPITAL_COMMUNITY)
Admission: EM | Admit: 2019-07-17 | Discharge: 2019-07-19 | DRG: 282 | Disposition: A | Discharge status: Home / Self Care | Payer: Self-pay | Attending: Cardiology | Admitting: Cardiology

## 2019-07-17 ENCOUNTER — Emergency Department (HOSPITAL_COMMUNITY): Payer: Self-pay

## 2019-07-17 ENCOUNTER — Inpatient Hospital Stay (HOSPITAL_COMMUNITY): Payer: Self-pay

## 2019-07-17 DIAGNOSIS — R079 Chest pain, unspecified: Secondary | ICD-10-CM

## 2019-07-17 DIAGNOSIS — I10 Essential (primary) hypertension: Secondary | ICD-10-CM | POA: Diagnosis present

## 2019-07-17 DIAGNOSIS — Z79899 Other long term (current) drug therapy: Secondary | ICD-10-CM

## 2019-07-17 DIAGNOSIS — R778 Other specified abnormalities of plasma proteins: Secondary | ICD-10-CM

## 2019-07-17 DIAGNOSIS — I201 Angina pectoris with documented spasm: Secondary | ICD-10-CM

## 2019-07-17 DIAGNOSIS — Z716 Tobacco abuse counseling: Secondary | ICD-10-CM

## 2019-07-17 DIAGNOSIS — Z791 Long term (current) use of non-steroidal anti-inflammatories (NSAID): Secondary | ICD-10-CM

## 2019-07-17 DIAGNOSIS — R7989 Other specified abnormal findings of blood chemistry: Secondary | ICD-10-CM | POA: Insufficient documentation

## 2019-07-17 DIAGNOSIS — F17210 Nicotine dependence, cigarettes, uncomplicated: Secondary | ICD-10-CM | POA: Diagnosis present

## 2019-07-17 DIAGNOSIS — I25111 Atherosclerotic heart disease of native coronary artery with angina pectoris with documented spasm: Secondary | ICD-10-CM | POA: Diagnosis present

## 2019-07-17 DIAGNOSIS — F17200 Nicotine dependence, unspecified, uncomplicated: Secondary | ICD-10-CM | POA: Diagnosis present

## 2019-07-17 DIAGNOSIS — I214 Non-ST elevation (NSTEMI) myocardial infarction: Principal | ICD-10-CM | POA: Diagnosis present

## 2019-07-17 DIAGNOSIS — Z833 Family history of diabetes mellitus: Secondary | ICD-10-CM

## 2019-07-17 DIAGNOSIS — Z20822 Contact with and (suspected) exposure to covid-19: Secondary | ICD-10-CM | POA: Diagnosis present

## 2019-07-17 DIAGNOSIS — F1290 Cannabis use, unspecified, uncomplicated: Secondary | ICD-10-CM | POA: Diagnosis present

## 2019-07-17 DIAGNOSIS — Z8249 Family history of ischemic heart disease and other diseases of the circulatory system: Secondary | ICD-10-CM

## 2019-07-17 HISTORY — DX: Chest pain, unspecified: R07.9

## 2019-07-17 HISTORY — DX: Psoriasis, unspecified: L40.9

## 2019-07-17 HISTORY — DX: Tobacco use: Z72.0

## 2019-07-17 LAB — CBC WITH DIFFERENTIAL/PLATELET
Abs Immature Granulocytes: 0.01 10*3/uL (ref 0.00–0.07)
Basophils Absolute: 0 10*3/uL (ref 0.0–0.1)
Basophils Relative: 0 %
Eosinophils Absolute: 0 10*3/uL (ref 0.0–0.5)
Eosinophils Relative: 0 %
HCT: 35.7 % — ABNORMAL LOW (ref 36.0–46.0)
Hemoglobin: 12 g/dL (ref 12.0–15.0)
Immature Granulocytes: 0 %
Lymphocytes Relative: 43 %
Lymphs Abs: 1.8 10*3/uL (ref 0.7–4.0)
MCH: 30.9 pg (ref 26.0–34.0)
MCHC: 33.6 g/dL (ref 30.0–36.0)
MCV: 92 fL (ref 80.0–100.0)
Monocytes Absolute: 0.3 10*3/uL (ref 0.1–1.0)
Monocytes Relative: 6 %
Neutro Abs: 2.1 10*3/uL (ref 1.7–7.7)
Neutrophils Relative %: 51 %
Platelets: 205 10*3/uL (ref 150–400)
RBC: 3.88 MIL/uL (ref 3.87–5.11)
RDW: 12.3 % (ref 11.5–15.5)
WBC: 4.2 10*3/uL (ref 4.0–10.5)
nRBC: 0 % (ref 0.0–0.2)

## 2019-07-17 LAB — TROPONIN I (HIGH SENSITIVITY)
Troponin I (High Sensitivity): 176 ng/L (ref <18)
Troponin I (High Sensitivity): 337 ng/L (ref <18)
Troponin I (High Sensitivity): 563 ng/L (ref <18)
Troponin I (High Sensitivity): 1444 ng/L (ref <18)

## 2019-07-17 LAB — I-STAT BETA HCG BLOOD, ED (MC, WL, AP ONLY): I-stat hCG, quantitative: <5 m[IU]/mL (ref <5)

## 2019-07-17 LAB — RESPIRATORY PANEL BY RT PCR (FLU A&B, COVID)
Influenza A by PCR: NEGATIVE
Influenza B by PCR: NEGATIVE
SARS Coronavirus 2 by RT PCR: NEGATIVE

## 2019-07-17 LAB — BASIC METABOLIC PANEL
Anion gap: 15 (ref 5–15)
BUN: 8 mg/dL (ref 6–20)
CO2: 20 mmol/L — ABNORMAL LOW (ref 22–32)
Calcium: 9.1 mg/dL (ref 8.9–10.3)
Chloride: 101 mmol/L (ref 98–111)
Creatinine, Ser: 0.5 mg/dL (ref 0.44–1.00)
GFR calc Af Amer: >60 mL/min (ref >60)
GFR calc non Af Amer: >60 mL/min (ref >60)
Glucose, Bld: 75 mg/dL (ref 70–99)
Potassium: 3.8 mmol/L (ref 3.5–5.1)
Sodium: 136 mmol/L (ref 135–145)

## 2019-07-17 LAB — D-DIMER, QUANTITATIVE: D-Dimer, Quant: 0.35 ug/mL-FEU (ref 0.00–0.50)

## 2019-07-17 LAB — HEPATIC FUNCTION PANEL
ALT: 22 U/L (ref 0–44)
AST: 42 U/L — ABNORMAL HIGH (ref 15–41)
Albumin: 4.1 g/dL (ref 3.5–5.0)
Alkaline Phosphatase: 69 U/L (ref 38–126)
Bilirubin, Direct: 0.2 mg/dL (ref 0.0–0.2)
Indirect Bilirubin: 0.6 mg/dL (ref 0.3–0.9)
Total Bilirubin: 0.8 mg/dL (ref 0.3–1.2)
Total Protein: 8.3 g/dL — ABNORMAL HIGH (ref 6.5–8.1)

## 2019-07-17 LAB — SEDIMENTATION RATE: Sed Rate: 20 mm/hr (ref 0–22)

## 2019-07-17 LAB — C-REACTIVE PROTEIN: CRP: <0.5 mg/dL (ref <1.0)

## 2019-07-17 LAB — TSH: TSH: 1.716 u[IU]/mL (ref 0.350–4.500)

## 2019-07-17 LAB — RAPID URINE DRUG SCREEN, HOSP PERFORMED
Amphetamines: NOT DETECTED
Barbiturates: NOT DETECTED
Benzodiazepines: NOT DETECTED
Cocaine: NOT DETECTED
Opiates: NOT DETECTED
Tetrahydrocannabinol: POSITIVE — AB

## 2019-07-17 LAB — MAGNESIUM: Magnesium: 2.5 mg/dL — ABNORMAL HIGH (ref 1.7–2.4)

## 2019-07-17 LAB — ECHOCARDIOGRAM COMPLETE
Height: 66 in
Weight: 1760 oz

## 2019-07-17 LAB — T4, FREE: Free T4: 1 ng/dL (ref 0.61–1.12)

## 2019-07-17 LAB — HIV ANTIBODY (ROUTINE TESTING W REFLEX): HIV Screen 4th Generation wRfx: NONREACTIVE

## 2019-07-17 LAB — HEMOGLOBIN A1C
Hgb A1c MFr Bld: 5.2 % (ref 4.8–5.6)
Mean Plasma Glucose: 102.54 mg/dL

## 2019-07-17 MED ORDER — KETOROLAC TROMETHAMINE 30 MG/ML IJ SOLN
15.0000 mg | Freq: Once | INTRAMUSCULAR | Status: AC
  Administered 2019-07-17: 09:00:00 15 mg via INTRAVENOUS
  Filled 2019-07-17: qty 1

## 2019-07-17 MED ORDER — LIDOCAINE VISCOUS HCL 2 % MT SOLN
15.0000 mL | Freq: Once | OROMUCOSAL | Status: AC
  Administered 2019-07-17: 09:00:00 15 mL via ORAL
  Filled 2019-07-17: qty 15

## 2019-07-17 MED ORDER — SODIUM CHLORIDE 0.9% FLUSH: 3.0000 mL | INTRAVENOUS | Status: DC | PRN

## 2019-07-17 MED ORDER — ALUM & MAG HYDROXIDE-SIMETH 200-200-20 MG/5ML PO SUSP
30.0000 mL | Freq: Once | ORAL | Status: AC
  Administered 2019-07-17: 09:00:00 30 mL via ORAL
  Filled 2019-07-17: qty 30

## 2019-07-17 MED ORDER — HEPARIN SODIUM (PORCINE) 5000 UNIT/ML IJ SOLN
5000.0000 [IU] | Freq: Three times a day (TID) | INTRAMUSCULAR | Status: DC
  Administered 2019-07-17 – 2019-07-18 (×2): 5000 [IU] via SUBCUTANEOUS
  Filled 2019-07-17 (×2): qty 1

## 2019-07-17 MED ORDER — SODIUM CHLORIDE 0.9% FLUSH
3.0000 mL | Freq: Two times a day (BID) | INTRAVENOUS | Status: DC
  Administered 2019-07-17 – 2019-07-18 (×3): 3 mL via INTRAVENOUS

## 2019-07-17 MED ORDER — ALPRAZOLAM 0.25 MG PO TABS
0.2500 mg | ORAL_TABLET | Freq: Two times a day (BID) | ORAL | Status: DC | PRN
  Administered 2019-07-18: 04:00:00 0.25 mg via ORAL
  Filled 2019-07-17: qty 1

## 2019-07-17 MED ORDER — ASPIRIN 325 MG PO TABS: 325.0000 mg | ORAL_TABLET | Freq: Once | ORAL | Status: DC

## 2019-07-17 MED ORDER — SODIUM CHLORIDE 0.9 % IV SOLN: 250.0000 mL | INTRAVENOUS | Status: DC | PRN

## 2019-07-17 MED ORDER — ONDANSETRON HCL 4 MG/2ML IJ SOLN: 4.0000 mg | Freq: Four times a day (QID) | INTRAMUSCULAR | Status: DC | PRN

## 2019-07-17 MED ORDER — METOPROLOL TARTRATE 12.5 MG HALF TABLET
12.5000 mg | ORAL_TABLET | Freq: Two times a day (BID) | ORAL | Status: DC
  Administered 2019-07-17: 12.5 mg via ORAL
  Filled 2019-07-17 (×2): qty 1

## 2019-07-17 MED ORDER — IBUPROFEN 600 MG PO TABS
600.0000 mg | ORAL_TABLET | Freq: Three times a day (TID) | ORAL | Status: DC
  Administered 2019-07-17: 13:00:00 600 mg via ORAL
  Filled 2019-07-17: qty 1

## 2019-07-17 MED ORDER — NITROGLYCERIN 0.4 MG SL SUBL
0.4000 mg | SUBLINGUAL_TABLET | SUBLINGUAL | Status: DC | PRN
  Administered 2019-07-18 (×2): 0.4 mg via SUBLINGUAL
  Filled 2019-07-17: qty 1

## 2019-07-17 MED ORDER — INFLUENZA VAC SPLIT QUAD 0.5 ML IM SUSY
0.5000 mL | PREFILLED_SYRINGE | INTRAMUSCULAR | Status: DC | PRN
  Filled 2019-07-17: qty 0.5

## 2019-07-17 MED ORDER — ACETAMINOPHEN 325 MG PO TABS
650.0000 mg | ORAL_TABLET | ORAL | Status: DC | PRN
  Administered 2019-07-18 (×2): 650 mg via ORAL
  Filled 2019-07-17 (×2): qty 2

## 2019-07-17 MED ORDER — ASPIRIN 81 MG PO CHEW: 324.0000 mg | CHEWABLE_TABLET | Freq: Once | ORAL | Status: DC

## 2019-07-17 MED ORDER — COLCHICINE 0.6 MG PO TABS
0.6000 mg | ORAL_TABLET | Freq: Two times a day (BID) | ORAL | Status: DC
  Administered 2019-07-17: 13:00:00 0.6 mg via ORAL
  Filled 2019-07-17: qty 1

## 2019-07-17 MED ORDER — ATORVASTATIN CALCIUM 40 MG PO TABS
40.0000 mg | ORAL_TABLET | Freq: Every day | ORAL | Status: DC
  Administered 2019-07-17 – 2019-07-18 (×2): 40 mg via ORAL
  Filled 2019-07-17 (×2): qty 1

## 2019-07-17 NOTE — ED Provider Notes (Signed)
[facility] EMERGENCY DEPARTMENT Provider Note   CSN: [id number] Arrival date & time: 07/17/19  2993     History Chief Complaint  Patient presents with  . Chest Pain    Janice Esparza is a 29 y.o. female.  Patient brought in by EMS with chest pain.  Contrary to triage note, chest pain did not start getting intercourse.  States she woke up early this morning and had intercourse without a problem.  She developed chest pain while lying in bed after using the bathroom.  She had no pain during intercourse or pain walking to the bathroom.  She describes pain and aching in the center of her chest that is worse when she lies down and better when she sits up.  There is no radiation of the pain.  She denies any shortness of breath.  Does have some nausea but no vomiting, diaphoresis or sweating.  Denies any abdominal pain.  No back pain.  He has never had this kind of pain before.  Admits to using alcohol and marijuana last night but denies cocaine.  Denies any cardiac history.  Found to be hypertensive for EMS with T wave versions in lead III. She denies any focal weakness, numbness, tingling.  She denies any possibility of pregnancy. He has never had any exertional chest pain before.  The history is provided by the patient and the EMS personnel.  Chest Pain Associated symptoms: nausea   Associated symptoms: no abdominal pain, no dizziness, no fever, no headache, no shortness of breath, no vomiting and no weakness        Past Medical History:  Diagnosis Date  . Eczema, allergic     Patient Active Problem List   Diagnosis Date Noted  . 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 TOBACCO USER 03/07/2009  . BACTERIAL VAGINITIS 02/03/2009  . NAUSEA AND VOMITING 02/03/2009  . RHINITIS, ALLERGIC 08/17/2006  . ECZEMA, ATOPIC DERMATITIS 08/17/2006    History reviewed. No pertinent surgical history.   OB History   No obstetric history on file.     Family History  Problem  Relation Age of Onset  . Diabetes Other   . Hypertension Other     Social History   Tobacco Use  . Smoking status: Current Some Day Smoker    Packs/day: 0.50    Types: Cigarettes  . Smokeless tobacco: Never Used  Substance Use Topics  . Alcohol use: No  . Drug use: No    Home Medications Prior to Admission medications   Medication Sig Start Date End Date Taking? Authorizing Provider  cephALEXin (KEFLEX) 500 MG capsule Take 1 capsule (500 mg total) by mouth 2 (two) times daily. 06/27/18   Azalia Bilis, MD  ibuprofen (ADVIL,MOTRIN) 600 MG tablet Take 1 tablet (600 mg total) by mouth every 8 (eight) hours as needed. 06/27/18   Azalia Bilis, MD  ondansetron (ZOFRAN ODT) 8 MG disintegrating tablet Take 1 tablet (8 mg total) by mouth every 8 (eight) hours as needed for nausea or vomiting. 06/27/18   Azalia Bilis, MD    Allergies    Patient has no known allergies.  Review of Systems   Review of Systems  Constitutional: Negative for activity change, appetite change and fever.  HENT: Negative for congestion and rhinorrhea.   Eyes: Negative for visual disturbance.  Respiratory: Positive for chest tightness. Negative for shortness of breath.   Cardiovascular: Positive for chest pain.  Gastrointestinal: Positive for nausea. Negative for abdominal pain and vomiting.  Genitourinary:  Negative for dysuria and hematuria.  Musculoskeletal: Negative for arthralgias and myalgias.  Skin: Negative for rash.  Neurological: Negative for dizziness, weakness and headaches.   all other systems are negative except as noted in the HPI and PMH.    Physical Exam Updated Vital Signs BP (!) 159/115   Pulse 73   Temp 98.3 F (36.8 C) (Oral)   Resp 20   Ht 5\' 6"  (1.676 m)   Wt 49.9 kg   SpO2 100%   BMI 17.75 kg/m   Physical Exam Vitals and nursing note reviewed.  Constitutional:      General: She is not in acute distress.    Appearance: She is well-developed.     Comments: Anxious and tearful    HENT:     Head: Normocephalic and atraumatic.     Mouth/Throat:     Pharynx: No oropharyngeal exudate.  Eyes:     Conjunctiva/sclera: Conjunctivae normal.     Pupils: Pupils are equal, round, and reactive to light.  Neck:     Comments: No meningismus. Cardiovascular:     Rate and Rhythm: Normal rate and regular rhythm.     Heart sounds: Normal heart sounds. No murmur.  Pulmonary:     Effort: Pulmonary effort is normal. No respiratory distress.     Breath sounds: Normal breath sounds.     Comments: Reproducible central chest tenderness Chest:     Chest wall: Tenderness present.  Abdominal:     Palpations: Abdomen is soft.     Tenderness: There is no abdominal tenderness. There is no guarding or rebound.  Musculoskeletal:        General: No tenderness. Normal range of motion.     Cervical back: Normal range of motion and neck supple.  Skin:    General: Skin is warm.     Capillary Refill: Capillary refill takes less than 2 seconds.  Neurological:     General: No focal deficit present.     Mental Status: She is alert and oriented to person, place, and time. Mental status is at baseline.     Cranial Nerves: No cranial nerve deficit.     Motor: No abnormal muscle tone.     Coordination: Coordination normal.     Comments:  5/5 strength throughout. CN 2-12 intact.Equal grip strength.   Psychiatric:        Behavior: Behavior normal.     ED Results / Procedures / Treatments   Labs (all labs ordered are listed, but only abnormal results are displayed) Labs Reviewed  CBC WITH DIFFERENTIAL/PLATELET - Abnormal; Notable for the following components:      Result Value   HCT 35.7 (*)    All other components within normal limits  BASIC METABOLIC PANEL - Abnormal; Notable for the following components:   CO2 20 (*)    All other components within normal limits  RAPID URINE DRUG SCREEN, HOSP PERFORMED - Abnormal; Notable for the following components:   Tetrahydrocannabinol POSITIVE (*)     All other components within normal limits  TROPONIN I (HIGH SENSITIVITY) - Abnormal; Notable for the following components:   Troponin I (High Sensitivity) 176 (*)    All other components within normal limits  D-DIMER, QUANTITATIVE (NOT AT [facility])  I-STAT BETA HCG BLOOD, ED ([facility], [facility], [facility] ONLY)    EKG EKG Interpretation  Date/Time:  Wednesday July 17 2019 08:23:55 EST Ventricular Rate:  73 PR Interval:    QRS Duration: 91 QT Interval:  414 QTC Calculation: 457  R Axis:   71 Text Interpretation: Sinus arrhythmia RSR' in V1 or V2, probably normal variant Borderline T abnormalities, inferior leads T wave inversion lead v3 Confirmed by Glynn Octave [phone]) on 07/17/2019 8:30:05 AM   Radiology DG Chest Portable 1 View  Result Date: 07/17/2019 CLINICAL DATA:  Pt from home with c.o central chest pain that started about 1.5 hrs ago after having intercourse. Pt tried laying down to see if it would improve but it only got worse. Pt admits to drinking alcohol and smoking marijuana last night but nothing more than her usual. EXAM: PORTABLE CHEST 1 VIEW COMPARISON:  10/15/2014 FINDINGS: Cardiac silhouette normal in size. Normal mediastinal and hilar contours. Clear lungs.  No pleural effusion or pneumothorax. Skeletal structures are grossly intact. IMPRESSION: No active disease. Electronically Signed   By: Amie Portland M.D.   On: 07/17/2019 09:08    Procedures .Critical Care Performed by: Glynn Octave, MD Authorized by: Glynn Octave, MD   Critical care provider statement:    Critical care time (minutes):  35   Critical care was time spent personally by me on the following activities:  Discussions with consultants, evaluation of patient's response to treatment, examination of patient, ordering and performing treatments and interventions, ordering and review of laboratory studies, ordering and review of radiographic studies, pulse oximetry, re-evaluation of patient's condition,  obtaining history from patient or surrogate and review of old charts   (including critical care time)  Medications Ordered in ED Medications - No data to display  ED Course  I have reviewed the triage vital signs and the nursing notes.  Pertinent labs & imaging results that were available during my care of the patient were reviewed by me and considered in my medical decision making (see chart for details).    MDM Rules/Calculators/A&P                      Central chest pain for the past 90 minutes that is constant, worse with palpation worse with sitting up.  EKG does show nonspecific T wave inversion in lead III.  Pain did not occur during intercourse, contrary to triage note.  EKG is abnormal.  Patient's pain seems atypical and is reproducible however.  Troponin positive at 176.  Patient given aspirin. D-dimer negative.  Discussed with cardiology who will evaluate.  Pain improved with GI cocktail and toradol. Troponin continues to rise. Consider pericarditis/myocarditis.  Cardiology to admit for serial enzymes and echo.    DANEE SOLLER was evaluated in Emergency Department on 07/17/2019 for the symptoms described in the history of present illness. She was evaluated in the context of the global COVID-19 pandemic, which necessitated consideration that the patient might be at risk for infection with the SARS-CoV-2 virus that causes COVID-19. Institutional protocols and algorithms that pertain to the evaluation of patients at risk for COVID-19 are in a state of rapid change based on information released by regulatory bodies including the CDC and federal and state organizations. These policies and algorithms were followed during the patient's care in the ED.  Final Clinical Impression(s) / ED Diagnoses Final diagnoses:  None    Rx / DC Orders ED Discharge Orders    None       Carollee Nussbaumer, Jeannett Senior, MD 07/17/19 1821

## 2019-07-17 NOTE — Progress Notes (Signed)
Patient is feeling better and reports pain has completely resolved.  Sed rate and CRP are normal.  Troponin up to 563  Echo reviewed - no pericardial effusion and LV function looks good to me. Full report pending.  Above findings make it less likely that this is pericarditis. Other possibility includes coronary spasm or SCAD.   Will continue to cycle enzymes and follow up Ecg in am.   If recurrent chest pain will need to consider cardiac cath.  Suad Autrey [place] MD, [facility]

## 2019-07-17 NOTE — Plan of Care (Signed)
  Problem: Activity: Goal: Risk for activity intolerance will decrease Outcome: Progressing   Problem: Nutrition: Goal: Adequate nutrition will be maintained Outcome: Progressing   Problem: Pain Managment: Goal: General experience of comfort will improve Outcome: Progressing   Problem: Safety: Goal: Ability to remain free from injury will improve Outcome: Progressing   

## 2019-07-17 NOTE — ED Notes (Signed)
Troponin 176  Reported to Dr. Manus Gunning

## 2019-07-17 NOTE — H&P (Addendum)
Cardiology Consultation:   Patient ID: Janice Esparza MRN: [id number]; DOB: 1990/12/26  Admit date: 07/17/2019 Date of Consult: 07/17/2019  Primary Care Provider: Patient, No Pcp Per Primary Cardiologist: No primary care provider on file. new Dr. [place] Primary Electrophysiologist:  None   CC:   Chest pain  Patient Profile:   Janice Esparza is a 29 y.o. female with a hx of peritonsilar abscess , + tobacco, + marijuana use  who is being seen today for the evaluation of chest pain at the request of Dr. Wyvonnia Dusky.  History of Present Illness:   Janice Esparza with no PMH of cardiac issues, HTN or diabetes presents to ER with chest pain after using the BR.  Pain increases with lying down and improves with sitting up.  mild + nausea but no vomiting, she did feel she would pass out initially.  Her breathing was not normal but not SOB.  Increase of chest pain with deep breathing.   BP elevated with EMS     No colds or fevers no recent colds in last couple of months. No syncope.  No travel.    EKG:  The EKG was personally reviewed and demonstrates:  SR in 70s and T wave inversion V3  Telemetry:  Telemetry was personally reviewed and demonstrates:  SR  Na 136 k+ 3.8 Cr 0.50  Troponin hs 176  Hgb 12 plts 205 Wbc 4.2  ddimer 0.35 Neg pregnancy  UDS + marijuana PCXR NAD  Rec'd GI cocktail and toradol and is feeling better no pain except with deep breath and improved.    Heart Pathway Score:     Past Medical History:  Diagnosis Date  . Eczema, allergic     History reviewed. No pertinent surgical history.   Home Medications:  Prior to Admission medications   Not on File    Inpatient Medications: Scheduled Meds: . aspirin  324 mg Oral Once   Continuous Infusions:  PRN Meds:   Allergies:   No Known Allergies  Social History:   Social History   Socioeconomic History  . Marital status: Single    Spouse name: Not on file  . Number of children: Not on file  . Years of  education: Not on file  . Highest education level: Not on file  Occupational History  . Not on file  Tobacco Use  . Smoking status: Current Some Day Smoker    Packs/day: 0.50    Types: Cigarettes  . Smokeless tobacco: Never Used  Substance and Sexual Activity  . Alcohol use: No  . Drug use: No  . Sexual activity: Yes    Birth control/protection: None  Other Topics Concern  . Not on file  Social History Narrative  . Not on file   Social Determinants of Health   Financial Resource Strain:   . Difficulty of Paying Living Expenses: Not on file  Food Insecurity:   . Worried About Charity fundraiser in the Last Year: Not on file  . Ran Out of Food in the Last Year: Not on file  Transportation Needs:   . Lack of Transportation (Medical): Not on file  . Lack of Transportation (Non-Medical): Not on file  Physical Activity:   . Days of Exercise per Week: Not on file  . Minutes of Exercise per Session: Not on file  Stress:   . Feeling of Stress : Not on file  Social Connections:   . Frequency of Communication with Friends and Family: Not on file  .  Frequency of Social Gatherings with Friends and Family: Not on file  . Attends Religious Services: Not on file  . Active Member of Clubs or Organizations: Not on file  . Attends Banker Meetings: Not on file  . Marital Status: Not on file  Intimate Partner Violence:   . Fear of Current or Ex-Partner: Not on file  . Emotionally Abused: Not on file  . Physically Abused: Not on file  . Sexually Abused: Not on file    Family History:    Family History  Problem Relation Age of Onset  . Healthy Mother   . Healthy Father   . Diabetes Other   . Hypertension Other   . Heart disease Paternal Grandfather   . Arrhythmia Paternal Grandfather      ROS:  Please see the history of present illness.  General:no colds or fevers, no weight changes Skin:no rashes or ulcers HEENT:no blurred vision, no congestion CV:see  HPI PUL:see HPI GI:no diarrhea constipation or melena, no indigestion GU:no hematuria, no dysuria MS:no joint pain, no claudication Neuro:no syncope, no lightheadedness Endo:no diabetes, no thyroid disease GYN: last menstrual period last week.    All other ROS reviewed and negative.     Physical Exam/Data:   Vitals:   07/17/19 0830 07/17/19 0845 07/17/19 0900 07/17/19 1100  BP: (!) 139/105 (!) 135/110 (!) 151/117 122/89  Pulse:  90 74 68  Resp: 20 15 19 18   Temp:      TempSrc:      SpO2:  100% 100% 99%  Weight:      Height:       No intake or output data in the 24 hours ending 07/17/19 1153 Last 3 Weights 07/17/2019 10/08/2018 06/27/2018  Weight (lbs) 110 lb 115 lb 110 lb  Weight (kg) 49.896 kg 52.164 kg 49.896 kg     Body mass index is 17.75 kg/m.  General:  Well nourished, well developed, thin female in no acute distress though anxious HEENT: normal Lymph: no adenopathy Neck: no JVD Endocrine:  No thryomegaly Vascular: No carotid bruits; pedal pulses 2+ bilaterally   Cardiac:  normal S1, S2; RRR; no murmur gallup rub or click Lungs:  clear to auscultation bilaterally, no wheezing, rhonchi or rales  Abd: soft, nontender, no hepatomegaly  Ext: no edema Musculoskeletal:  No deformities, BUE and BLE strength normal and equal Skin: warm and dry  Neuro:  Alert and oriented X 3 MAE follows commands, no focal abnormalities noted Psych:  Normal affect though tearful - she does not like hospitals.    Relevant CV Studies: none  Laboratory Data:  High Sensitivity Troponin:   Recent Labs  Lab 07/17/19 0834 07/17/19 1036  TROPONINIHS 176* 337*     Chemistry Recent Labs  Lab 07/17/19 0834  NA 136  K 3.8  CL 101  CO2 20*  GLUCOSE 75  BUN 8  CREATININE 0.50  CALCIUM 9.1  GFRNONAA >60  GFRAA >60  ANIONGAP 15    No results for input(s): PROT, ALBUMIN, AST, ALT, ALKPHOS, BILITOT in the last 168 hours. Hematology Recent Labs  Lab 07/17/19 0834  WBC 4.2  RBC  3.88  HGB 12.0  HCT 35.7*  MCV 92.0  MCH 30.9  MCHC 33.6  RDW 12.3  PLT 205   BNPNo results for input(s): BNP, PROBNP in the last 168 hours.  DDimer  Recent Labs  Lab 07/17/19 0935  DDIMER 0.35     Radiology/Studies:  DG Chest Portable 1 View  Result  Date: 07/17/2019 CLINICAL DATA:  Pt from home with c.o central chest pain that started about 1.5 hrs ago after having intercourse. Pt tried laying down to see if it would improve but it only got worse. Pt admits to drinking alcohol and smoking marijuana last night but nothing more than her usual. EXAM: PORTABLE CHEST 1 VIEW COMPARISON:  10/15/2014 FINDINGS: Cardiac silhouette normal in size. Normal mediastinal and hilar contours. Clear lungs.  No pleural effusion or pneumothorax. Skeletal structures are grossly intact. IMPRESSION: No active disease. Electronically Signed   By: Amie Portland M.D.   On: 07/17/2019 09:08       HEAR Score (for undifferentiated chest pain):   3    Assessment and Plan:   1. Chest pain with elevated troponin 176 and second one 337 , second troponin pending,  Pain improved with GI cocktail and toradol - pain greater with deep breath and lying back.  Abnormal EKG with T wave inversion in V3.  ddimer is neg.    With elevated troponin concern for CAD or coronary abnormality vs pericarditis.  No severe SOB but takotsubo cardiomyopathy could be another cause.  Will check Echo.    Dr. [place] to see but would admit for eval.   Repeat troponin every 6 hours.  Will add lopressor for now and lipitor 40 daily.  May not need if pericarditis.  2. HTN elevated but improving with pain relief 3. Tobacco use , will recommend stopping. 4. Urine for preg is neg.        For questions or updates, please contact [facility] Please consult [email or web address] for contact info under     Signed, Nada Boozer, NP  07/17/2019 11:53 AM

## 2019-07-17 NOTE — Progress Notes (Signed)
  Echocardiogram 2D Echocardiogram has been performed.  Celene Skeen 07/17/2019, 3:05 PM

## 2019-07-17 NOTE — ED Triage Notes (Signed)
Pt from home with c.o central chest pain that started about 1.5 hrs ago after having intercourse. Pt tried laying down to see if it would improve but it only got worse. Pt admits to drinking alcohol and smoking marijuana last night but nothing more than her usual. Pt tearful during triage. Pt given 324 ASA en route.   BP 164/104 HR 70 NSR

## 2019-07-18 ENCOUNTER — Encounter (HOSPITAL_COMMUNITY)
Admission: EM | Disposition: A | Discharge status: Home / Self Care | Payer: Self-pay | Source: Home / Self Care | Attending: Cardiology

## 2019-07-18 ENCOUNTER — Inpatient Hospital Stay (HOSPITAL_COMMUNITY): Payer: Self-pay

## 2019-07-18 DIAGNOSIS — I214 Non-ST elevation (NSTEMI) myocardial infarction: Secondary | ICD-10-CM

## 2019-07-18 DIAGNOSIS — Z720 Tobacco use: Secondary | ICD-10-CM

## 2019-07-18 HISTORY — PX: LEFT HEART CATH AND CORONARY ANGIOGRAPHY: CATH118249

## 2019-07-18 LAB — CBC
HCT: 37.5 % (ref 36.0–46.0)
Hemoglobin: 12.5 g/dL (ref 12.0–15.0)
MCH: 30.7 pg (ref 26.0–34.0)
MCHC: 33.3 g/dL (ref 30.0–36.0)
MCV: 92.1 fL (ref 80.0–100.0)
Platelets: 190 10*3/uL (ref 150–400)
RBC: 4.07 MIL/uL (ref 3.87–5.11)
RDW: 12.1 % (ref 11.5–15.5)
WBC: 3.7 10*3/uL — ABNORMAL LOW (ref 4.0–10.5)
nRBC: 0 % (ref 0.0–0.2)

## 2019-07-18 LAB — LIPID PANEL
Cholesterol: 148 mg/dL (ref 0–200)
HDL: 78 mg/dL (ref >40)
LDL Cholesterol: 62 mg/dL (ref 0–99)
Total CHOL/HDL Ratio: 1.9 RATIO
Triglycerides: 39 mg/dL (ref <150)
VLDL: 8 mg/dL (ref 0–40)

## 2019-07-18 LAB — BASIC METABOLIC PANEL
Anion gap: 10 (ref 5–15)
BUN: 17 mg/dL (ref 6–20)
CO2: 23 mmol/L (ref 22–32)
Calcium: 9.1 mg/dL (ref 8.9–10.3)
Chloride: 103 mmol/L (ref 98–111)
Creatinine, Ser: 0.68 mg/dL (ref 0.44–1.00)
GFR calc Af Amer: >60 mL/min (ref >60)
GFR calc non Af Amer: >60 mL/min (ref >60)
Glucose, Bld: 80 mg/dL (ref 70–99)
Potassium: 3.4 mmol/L — ABNORMAL LOW (ref 3.5–5.1)
Sodium: 136 mmol/L (ref 135–145)

## 2019-07-18 LAB — TROPONIN I (HIGH SENSITIVITY): Troponin I (High Sensitivity): 1434 ng/L (ref <18)

## 2019-07-18 SURGERY — LEFT HEART CATH AND CORONARY ANGIOGRAPHY
Anesthesia: LOCAL

## 2019-07-18 MED ORDER — ASPIRIN 81 MG PO CHEW
81.0000 mg | CHEWABLE_TABLET | Freq: Every day | ORAL | Status: DC
  Administered 2019-07-19: 81 mg via ORAL
  Filled 2019-07-18 (×2): qty 1

## 2019-07-18 MED ORDER — GADOBUTROL 1 MMOL/ML IV SOLN
7.0000 mL | Freq: Once | INTRAVENOUS | Status: AC | PRN
  Administered 2019-07-18: 17:00:00 7 mL via INTRAVENOUS

## 2019-07-18 MED ORDER — SODIUM CHLORIDE 0.9% FLUSH: 3.0000 mL | INTRAVENOUS | Status: DC | PRN

## 2019-07-18 MED ORDER — POTASSIUM CHLORIDE CRYS ER 20 MEQ PO TBCR
20.0000 meq | EXTENDED_RELEASE_TABLET | Freq: Once | ORAL | Status: AC
  Administered 2019-07-18: 20 meq via ORAL
  Filled 2019-07-18: qty 1

## 2019-07-18 MED ORDER — SODIUM CHLORIDE 0.9 % WEIGHT BASED INFUSION: 1.0000 mL/kg/h | INTRAVENOUS | Status: DC

## 2019-07-18 MED ORDER — VERAPAMIL HCL 2.5 MG/ML IV SOLN
INTRAVENOUS | Status: AC
  Filled 2019-07-18: qty 2

## 2019-07-18 MED ORDER — HEPARIN (PORCINE) 25000 UT/250ML-% IV SOLN: 550.0000 [IU]/h | INTRAVENOUS | Status: DC

## 2019-07-18 MED ORDER — HEPARIN (PORCINE) IN NACL 1000-0.9 UT/500ML-% IV SOLN
INTRAVENOUS | Status: AC
  Filled 2019-07-18: qty 1000

## 2019-07-18 MED ORDER — LABETALOL HCL 5 MG/ML IV SOLN: 10.0000 mg | INTRAVENOUS | Status: AC | PRN

## 2019-07-18 MED ORDER — SODIUM CHLORIDE 0.9 % IV SOLN: 250.0000 mL | INTRAVENOUS | Status: DC | PRN

## 2019-07-18 MED ORDER — HEPARIN BOLUS VIA INFUSION
1000.0000 [IU] | Freq: Once | INTRAVENOUS | Status: DC
  Filled 2019-07-18: qty 1000

## 2019-07-18 MED ORDER — HEPARIN (PORCINE) IN NACL 1000-0.9 UT/500ML-% IV SOLN
INTRAVENOUS | Status: DC | PRN
  Administered 2019-07-18: 500 mL

## 2019-07-18 MED ORDER — CLOPIDOGREL BISULFATE 75 MG PO TABS
300.0000 mg | ORAL_TABLET | Freq: Every day | ORAL | Status: AC
  Administered 2019-07-18: 11:00:00 300 mg via ORAL
  Filled 2019-07-18: qty 4

## 2019-07-18 MED ORDER — MIDAZOLAM HCL 2 MG/2ML IJ SOLN
INTRAMUSCULAR | Status: DC | PRN
  Administered 2019-07-18: 0.5 mg via INTRAVENOUS
  Administered 2019-07-18: 1 mg via INTRAVENOUS

## 2019-07-18 MED ORDER — CLOPIDOGREL BISULFATE 75 MG PO TABS: 300.0000 mg | ORAL_TABLET | Freq: Every day | ORAL | Status: DC

## 2019-07-18 MED ORDER — FENTANYL CITRATE (PF) 100 MCG/2ML IJ SOLN
INTRAMUSCULAR | Status: AC
  Filled 2019-07-18: qty 2

## 2019-07-18 MED ORDER — ASPIRIN 81 MG PO CHEW
81.0000 mg | CHEWABLE_TABLET | ORAL | Status: AC
  Administered 2019-07-18: 81 mg via ORAL
  Filled 2019-07-18: qty 1

## 2019-07-18 MED ORDER — ONDANSETRON HCL 4 MG/2ML IJ SOLN: 4.0000 mg | Freq: Four times a day (QID) | INTRAMUSCULAR | Status: DC | PRN

## 2019-07-18 MED ORDER — OXYCODONE HCL 5 MG PO TABS: 5.0000 mg | ORAL_TABLET | ORAL | Status: DC | PRN

## 2019-07-18 MED ORDER — SODIUM CHLORIDE 0.9% FLUSH: 3.0000 mL | Freq: Two times a day (BID) | INTRAVENOUS | Status: DC

## 2019-07-18 MED ORDER — SODIUM CHLORIDE 0.9 % IV SOLN: INTRAVENOUS | Status: AC

## 2019-07-18 MED ORDER — NITROGLYCERIN 1 MG/10 ML FOR IR/CATH LAB
INTRA_ARTERIAL | Status: AC
  Filled 2019-07-18: qty 10

## 2019-07-18 MED ORDER — ISOSORBIDE MONONITRATE ER 30 MG PO TB24
30.0000 mg | ORAL_TABLET | Freq: Every day | ORAL | Status: DC
  Administered 2019-07-18 – 2019-07-19 (×2): 30 mg via ORAL
  Filled 2019-07-18 (×2): qty 1

## 2019-07-18 MED ORDER — SODIUM CHLORIDE 0.9 % WEIGHT BASED INFUSION
3.0000 mL/kg/h | INTRAVENOUS | Status: DC
  Administered 2019-07-18: 3 mL/kg/h via INTRAVENOUS

## 2019-07-18 MED ORDER — ACETAMINOPHEN 325 MG PO TABS: 650.0000 mg | ORAL_TABLET | ORAL | Status: DC | PRN

## 2019-07-18 MED ORDER — MIDAZOLAM HCL 2 MG/2ML IJ SOLN
INTRAMUSCULAR | Status: AC
  Filled 2019-07-18: qty 2

## 2019-07-18 MED ORDER — VERAPAMIL HCL 2.5 MG/ML IV SOLN
INTRAVENOUS | Status: DC | PRN
  Administered 2019-07-18: 10 mL via INTRA_ARTERIAL

## 2019-07-18 MED ORDER — HYDRALAZINE HCL 20 MG/ML IJ SOLN: 10.0000 mg | INTRAMUSCULAR | Status: AC | PRN

## 2019-07-18 MED ORDER — LIDOCAINE HCL (PF) 1 % IJ SOLN
INTRAMUSCULAR | Status: AC
  Filled 2019-07-18: qty 30

## 2019-07-18 MED ORDER — LIDOCAINE HCL (PF) 1 % IJ SOLN
INTRAMUSCULAR | Status: DC | PRN
  Administered 2019-07-18: 2 mL

## 2019-07-18 MED ORDER — SODIUM CHLORIDE 0.9% FLUSH
3.0000 mL | Freq: Two times a day (BID) | INTRAVENOUS | Status: DC
  Administered 2019-07-18: 3 mL via INTRAVENOUS

## 2019-07-18 MED ORDER — METOPROLOL TARTRATE 25 MG PO TABS
25.0000 mg | ORAL_TABLET | Freq: Two times a day (BID) | ORAL | Status: DC
  Administered 2019-07-18 – 2019-07-19 (×3): 25 mg via ORAL
  Filled 2019-07-18 (×3): qty 1

## 2019-07-18 MED ORDER — FENTANYL CITRATE (PF) 100 MCG/2ML IJ SOLN
INTRAMUSCULAR | Status: DC | PRN
  Administered 2019-07-18 (×2): 25 ug via INTRAVENOUS

## 2019-07-18 MED ORDER — IOHEXOL 350 MG/ML SOLN
INTRAVENOUS | Status: DC | PRN
  Administered 2019-07-18: 75 mL

## 2019-07-18 MED ORDER — HEPARIN SODIUM (PORCINE) 1000 UNIT/ML IJ SOLN
INTRAMUSCULAR | Status: AC
  Filled 2019-07-18: qty 1

## 2019-07-18 MED ORDER — CLOPIDOGREL BISULFATE 75 MG PO TABS
75.0000 mg | ORAL_TABLET | Freq: Every day | ORAL | Status: DC
  Administered 2019-07-19: 75 mg via ORAL
  Filled 2019-07-18: qty 1

## 2019-07-18 MED ORDER — HEPARIN SODIUM (PORCINE) 1000 UNIT/ML IJ SOLN
INTRAMUSCULAR | Status: DC | PRN
  Administered 2019-07-18: 2500 [IU] via INTRAVENOUS

## 2019-07-18 MED ORDER — MORPHINE SULFATE (PF) 2 MG/ML IV SOLN
2.0000 mg | Freq: Once | INTRAVENOUS | Status: AC
  Administered 2019-07-18: 2 mg via INTRAVENOUS
  Filled 2019-07-18: qty 1

## 2019-07-18 SURGICAL SUPPLY — 12 items
CATH INFINITI MULTIPACK ANG 4F (CATHETERS) ×1 IMPLANT
CATH LAUNCHER 5F EBU3.0 (CATHETERS) IMPLANT
CATHETER LAUNCHER 5F EBU3.0 (CATHETERS) ×2
DEVICE RAD TR BAND REGULAR (VASCULAR PRODUCTS) ×1 IMPLANT
GLIDESHEATH SLEND A-KIT 6F 22G (SHEATH) ×1 IMPLANT
GUIDEWIRE INQWIRE 1.5J.035X260 (WIRE) IMPLANT
INQWIRE 1.5J .035X260CM (WIRE) ×2
KIT HEART LEFT (KITS) ×2 IMPLANT
PACK CARDIAC CATHETERIZATION (CUSTOM PROCEDURE TRAY) ×2 IMPLANT
SHEATH PROBE COVER 6X72 (BAG) ×1 IMPLANT
TRANSDUCER W/STOPCOCK (MISCELLANEOUS) ×2 IMPLANT
TUBING CIL FLEX 10 FLL-RA (TUBING) ×2 IMPLANT

## 2019-07-18 NOTE — Progress Notes (Deleted)
Patient called  Stated " woke up with chest pain " described pain like when she first came in yesterday, pain level 8/10 pointing midchest . NTG 1 sl  x2 given  W/  no relief per patient. Stat EKG  Done, MD on call paged.. patient appears anxious, talking on her phone crying. Alprazolam prn  given , c/o headache, tylenol given.. morphine ordered by dr Shirlee Latch, 2mg  given with relief at this time. Vital signs monitored. .  Output by Drain (mL) 07/16/19 0701 - 07/16/19 1900 07/16/19 1901 - 07/17/19 0700 07/17/19 0701 - 07/17/19 1900 07/17/19 1901 - 07/18/19 07/20/19  Patient has no LDAs of requested type attached.

## 2019-07-18 NOTE — CV Procedure (Signed)
   Coronary angiography and left ventriculography via right radial approach using real-time vascular ultrasound for access.  Normal coronary arteries.  No wall motion abnormality.  Clinical diagnosis: Myocardial infarction with no obstructive coronary disease.  Etiology could be microvascular versus vasospasm.  Rule out inflammatory disease related to myopericarditis.  No angiographic evidence of coronary dissection.  Consider cardiac MRI.

## 2019-07-18 NOTE — Progress Notes (Signed)
Progress Note  Patient Name: Janice Esparza Date of Encounter: 07/18/2019  Primary Cardiologist: New  Subjective   Patient had severe recurrent chest pain at 5:30 this am. Relieved after about 30 minutes with sl Ntg and Morphine. Feels better now but scared   Inpatient Medications    Scheduled Meds: . aspirin  324 mg Oral Once  . atorvastatin  40 mg Oral q1800  . heparin  1,000 Units Intravenous Once  . metoprolol tartrate  12.5 mg Oral BID  . sodium chloride flush  3 mL Intravenous Q12H   Continuous Infusions: . sodium chloride    . heparin     PRN Meds: sodium chloride, acetaminophen, ALPRAZolam, influenza vac split quadrivalent PF, nitroGLYCERIN, ondansetron (ZOFRAN) IV, sodium chloride flush   Vital Signs    Vitals:   07/17/19 1422 07/17/19 1639 07/17/19 2122 07/18/19 0428  BP: (!) 145/97 (!) 143/97 132/73   Pulse:  72 65   Resp: 18  18   Temp: 98.9 F (37.2 C)  98.1 F (36.7 C) 98.6 F (37 C)  TempSrc: Oral  Oral Oral  SpO2:   97%   Weight: 48.5 kg     Height: 5\' 6"  (1.676 m)       Intake/Output Summary (Last 24 hours) at 07/18/2019 0727 Last data filed at 07/17/2019 1641 Gross per 24 hour  Intake 3 ml  Output --  Net 3 ml   Last 3 Weights 07/17/2019 07/17/2019 10/08/2018  Weight (lbs) 107 lb 110 lb 115 lb  Weight (kg) 48.535 kg 49.896 kg 52.164 kg      Telemetry    NSR - Personally Reviewed  ECG    NSR with dynamic changes of anterolateral ischemia. - Personally Reviewed  Physical Exam    GEN: No acute distress.   Neck: No JVD Cardiac: RRR, no murmurs, rubs, or gallops.  Respiratory: Clear to auscultation bilaterally. GI: Soft, nontender, non-distended  MS: No edema; No deformity. Neuro:  Nonfocal  Psych: Normal affect   Labs    High Sensitivity Troponin:   Recent Labs  Lab 07/17/19 0834 07/17/19 1036 07/17/19 1226 07/17/19 1757 07/17/19 2340  TROPONINIHS 176* 337* 563* 1,444* 1,434*      Chemistry Recent Labs  Lab  07/17/19 0834 07/17/19 1757 07/18/19 0253  NA 136  --  136  K 3.8  --  3.4*  CL 101  --  103  CO2 20*  --  23  GLUCOSE 75  --  80  BUN 8  --  17  CREATININE 0.50  --  0.68  CALCIUM 9.1  --  9.1  PROT  --  8.3*  --   ALBUMIN  --  4.1  --   AST  --  42*  --   ALT  --  22  --   ALKPHOS  --  69  --   BILITOT  --  0.8  --   GFRNONAA >60  --  >60  GFRAA >60  --  >60  ANIONGAP 15  --  10     Hematology Recent Labs  Lab 07/17/19 0834 07/18/19 0253  WBC 4.2 3.7*  RBC 3.88 4.07  HGB 12.0 12.5  HCT 35.7* 37.5  MCV 92.0 92.1  MCH 30.9 30.7  MCHC 33.6 33.3  RDW 12.3 12.1  PLT 205 190    BNPNo results for input(s): BNP, PROBNP in the last 168 hours.   DDimer  Recent Labs  Lab 07/17/19 0935  DDIMER 0.35  Radiology    DG Chest Portable 1 View  Result Date: 07/17/2019 CLINICAL DATA:  Pt from home with c.o central chest pain that started about 1.5 hrs ago after having intercourse. Pt tried laying down to see if it would improve but it only got worse. Pt admits to drinking alcohol and smoking marijuana last night but nothing more than her usual. EXAM: PORTABLE CHEST 1 VIEW COMPARISON:  10/15/2014 FINDINGS: Cardiac silhouette normal in size. Normal mediastinal and hilar contours. Clear lungs.  No pleural effusion or pneumothorax. Skeletal structures are grossly intact. IMPRESSION: No active disease. Electronically Signed   By: Lajean Manes M.D.   On: 07/17/2019 09:08   ECHOCARDIOGRAM COMPLETE  Result Date: 07/17/2019   ECHOCARDIOGRAM REPORT   Patient Name:   Janice Esparza Date of Exam: 07/17/2019 Medical Rec #:  [id number]       Height:       66.0 in Accession #:    [id number]      Weight:       107.0 lb Date of Birth:  Aug 19, 1990       BSA:          1.53 m Patient Age:    29 years        BP:           145/97 mmHg Patient Gender: F               HR:           73 bpm. Exam Location:  Inpatient Procedure: 2D Echo Indications:    chest pain  History:        Patient has no prior  history of Echocardiogram examinations.                 Tobacco use.  Sonographer:    Jannett Celestine RDCS (AE) Referring Phys: [place] Comments: restricted mobility (patient unable to straighten left arm so exam performed supine). IMPRESSIONS  1. Left ventricular ejection fraction, by visual estimation, is 60 to 65%. The left ventricle has normal function. There is no left ventricular hypertrophy.  2. The left ventricle has no regional wall motion abnormalities.  3. Global right ventricle has normal systolic function.The right ventricular size is normal. No increase in right ventricular wall thickness.  4. Left atrial size was normal.  5. Right atrial size was normal.  6. The mitral valve is normal in structure. Trivial mitral valve regurgitation. No evidence of mitral stenosis.  7. The tricuspid valve is normal in structure.  8. The tricuspid valve is normal in structure. Tricuspid valve regurgitation is not demonstrated.  9. The aortic valve is normal in structure. Aortic valve regurgitation is not visualized. No evidence of aortic valve sclerosis or stenosis. 10. The pulmonic valve was grossly normal. Pulmonic valve regurgitation is not visualized. 11. The inferior vena cava is normal in size with greater than 50% respiratory variability, suggesting right atrial pressure of 3 mmHg. FINDINGS  Left Ventricle: Left ventricular ejection fraction, by visual estimation, is 60 to 65%. The left ventricle has normal function. The left ventricle has no regional wall motion abnormalities. There is no left ventricular hypertrophy. Left ventricular diastolic parameters were normal. Normal left atrial pressure. Right Ventricle: The right ventricular size is normal. No increase in right ventricular wall thickness. Global RV systolic function is has normal systolic function. Left Atrium: Left atrial size was normal in size. Right Atrium: Right atrial size was normal in size Pericardium: There  is no  evidence of pericardial effusion. Mitral Valve: The mitral valve is normal in structure. Trivial mitral valve regurgitation. No evidence of mitral valve stenosis by observation. Tricuspid Valve: The tricuspid valve is normal in structure. Tricuspid valve regurgitation is not demonstrated. Aortic Valve: The aortic valve is normal in structure. Aortic valve regurgitation is not visualized. The aortic valve is structurally normal, with no evidence of sclerosis or stenosis. Pulmonic Valve: The pulmonic valve was grossly normal. Pulmonic valve regurgitation is not visualized. Pulmonic regurgitation is not visualized. Aorta: The aortic root, ascending aorta and aortic arch are all structurally normal, with no evidence of dilitation or obstruction. Venous: The inferior vena cava is normal in size with greater than 50% respiratory variability, suggesting right atrial pressure of 3 mmHg. IAS/Shunts: No atrial level shunt detected by color flow Doppler. There is no evidence of a patent foramen ovale. No ventricular septal defect is seen or detected. There is no evidence of an atrial septal defect.  LEFT VENTRICLE PLAX 2D LVIDd:         4.40 cm  Diastology LVIDs:         2.90 cm  LV e' lateral:   14.70 cm/s LV PW:         0.90 cm  LV E/e' lateral: 5.2 LV IVS:        0.70 cm  LV e' medial:    7.72 cm/s LVOT diam:     2.00 cm  LV E/e' medial:  9.8 LV SV:         55 ml LV SV Index:   37.21 LVOT Area:     3.14 cm  LEFT ATRIUM           Index LA diam:      2.80 cm 1.83 cm/m LA Vol (A2C): 24.5 ml 15.98 ml/m LA Vol (A4C): 26.9 ml 17.55 ml/m  AORTIC VALVE LVOT Vmax:   91.40 cm/s LVOT Vmean:  65.000 cm/s LVOT VTI:    0.215 m  AORTA Ao Root diam: 2.50 cm MITRAL VALVE MV Area (PHT): 2.66 cm             SHUNTS MV PHT:        82.65 msec           Systemic VTI:  0.22 m MV Decel Time: 285 msec             Systemic Diam: 2.00 cm MV E velocity: 75.90 cm/s 103 cm/s MV A velocity: 74.70 cm/s 70.3 cm/s MV E/A ratio:  1.02       1.5   Jodelle Red MD Electronically signed by Jodelle Red MD Signature Date/Time: 07/17/2019/6:07:11 PM    Final     Cardiac Studies   Echo:  IMPRESSIONS    1. Left ventricular ejection fraction, by visual estimation, is 60 to 65%. The left ventricle has normal function. There is no left ventricular hypertrophy.  2. The left ventricle has no regional wall motion abnormalities.  3. Global right ventricle has normal systolic function.The right ventricular size is normal. No increase in right ventricular wall thickness.  4. Left atrial size was normal.  5. Right atrial size was normal.  6. The mitral valve is normal in structure. Trivial mitral valve regurgitation. No evidence of mitral stenosis.  7. The tricuspid valve is normal in structure.  8. The tricuspid valve is normal in structure. Tricuspid valve regurgitation is not demonstrated.  9. The aortic valve is normal in structure. Aortic valve regurgitation is  not visualized. No evidence of aortic valve sclerosis or stenosis. 10. The pulmonic valve was grossly normal. Pulmonic valve regurgitation is not visualized. 11. The inferior vena cava is normal in size with greater than 50% respiratory variability, suggesting right atrial pressure of 3 mmHg.  Patient Profile     29 y.o. female presents with severe chest pain and elevated troponin  Assessment & Plan    1.NSTEMI. patient had severe recurrent chest pain this am. Troponin elevated to 1444. Ecg with dynamic ST-T changes c/w anterolateral ischemia. 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 Only risk factor is tobacco use. On ASA, statin, beta blocker. Will add IV Heparin. Plan to proceed with cardiac cath this am. The procedure and risks were reviewed including but not limited to death, myocardial infarction, stroke, arrythmias, bleeding, transfusion, emergency surgery, dye allergy, or renal dysfunction. The patient voices understanding and is agreeable to proceed. 2. Tobacco  abuse - counseled on the importance of cessation. She is motivated to quit.   For questions or updates, please contact [facility] Please consult [email or web address] for contact info under        Signed, Latondra Gebhart [place], MD  07/18/2019, 7:27 AM

## 2019-07-18 NOTE — Progress Notes (Signed)
Patient called,stated"woke up from sleep with chest paiin" Described pain as "pressure "claimed just  like when she first came in yesterday. Pain level stated as 8/10 ,pointing to midchest. NTG SL 1 tab x2 given with no relief.Stat EKG done , MD on call was paged. Patient appears anxious ,conversing on phone crying. Alprazolam prn given c/o headache, tylenol given. Dr Jearld Pies made aware, morphine 2mg  given as ordered with relief at this time. Vital signs  Monitored.  NPO maintained as ordered.

## 2019-07-18 NOTE — H&P (View-Only) (Signed)
Progress Note  Patient Name: Janice Esparza Date of Encounter: 07/18/2019  Primary Cardiologist: New  Subjective   Patient had severe recurrent chest pain at 5:30 this am. Relieved after about 30 minutes with sl Ntg and Morphine. Feels better now but scared   Inpatient Medications    Scheduled Meds: . aspirin  324 mg Oral Once  . atorvastatin  40 mg Oral q1800  . heparin  1,000 Units Intravenous Once  . metoprolol tartrate  12.5 mg Oral BID  . sodium chloride flush  3 mL Intravenous Q12H   Continuous Infusions: . sodium chloride    . heparin     PRN Meds: sodium chloride, acetaminophen, ALPRAZolam, influenza vac split quadrivalent PF, nitroGLYCERIN, ondansetron (ZOFRAN) IV, sodium chloride flush   Vital Signs    Vitals:   07/17/19 1422 07/17/19 1639 07/17/19 2122 07/18/19 0428  BP: (!) 145/97 (!) 143/97 132/73   Pulse:  72 65   Resp: 18  18   Temp: 98.9 F (37.2 C)  98.1 F (36.7 C) 98.6 F (37 C)  TempSrc: Oral  Oral Oral  SpO2:   97%   Weight: 48.5 kg     Height: 5\' 6"  (1.676 m)       Intake/Output Summary (Last 24 hours) at 07/18/2019 0727 Last data filed at 07/17/2019 1641 Gross per 24 hour  Intake 3 ml  Output --  Net 3 ml   Last 3 Weights 07/17/2019 07/17/2019 10/08/2018  Weight (lbs) 107 lb 110 lb 115 lb  Weight (kg) 48.535 kg 49.896 kg 52.164 kg      Telemetry    NSR - Personally Reviewed  ECG    NSR with dynamic changes of anterolateral ischemia. - Personally Reviewed  Physical Exam    GEN: No acute distress.   Neck: No JVD Cardiac: RRR, no murmurs, rubs, or gallops.  Respiratory: Clear to auscultation bilaterally. GI: Soft, nontender, non-distended  MS: No edema; No deformity. Neuro:  Nonfocal  Psych: Normal affect   Labs    High Sensitivity Troponin:   Recent Labs  Lab 07/17/19 0834 07/17/19 1036 07/17/19 1226 07/17/19 1757 07/17/19 2340  TROPONINIHS 176* 337* 563* 1,444* 1,434*      Chemistry Recent Labs  Lab  07/17/19 0834 07/17/19 1757 07/18/19 0253  NA 136  --  136  K 3.8  --  3.4*  CL 101  --  103  CO2 20*  --  23  GLUCOSE 75  --  80  BUN 8  --  17  CREATININE 0.50  --  0.68  CALCIUM 9.1  --  9.1  PROT  --  8.3*  --   ALBUMIN  --  4.1  --   AST  --  42*  --   ALT  --  22  --   ALKPHOS  --  69  --   BILITOT  --  0.8  --   GFRNONAA >60  --  >60  GFRAA >60  --  >60  ANIONGAP 15  --  10     Hematology Recent Labs  Lab 07/17/19 0834 07/18/19 0253  WBC 4.2 3.7*  RBC 3.88 4.07  HGB 12.0 12.5  HCT 35.7* 37.5  MCV 92.0 92.1  MCH 30.9 30.7  MCHC 33.6 33.3  RDW 12.3 12.1  PLT 205 190    BNPNo results for input(s): BNP, PROBNP in the last 168 hours.   DDimer  Recent Labs  Lab 07/17/19 0935  DDIMER 0.35  Radiology    DG Chest Portable 1 View  Result Date: 07/17/2019 CLINICAL DATA:  Pt from home with c.o central chest pain that started about 1.5 hrs ago after having intercourse. Pt tried laying down to see if it would improve but it only got worse. Pt admits to drinking alcohol and smoking marijuana last night but nothing more than her usual. EXAM: PORTABLE CHEST 1 VIEW COMPARISON:  10/15/2014 FINDINGS: Cardiac silhouette normal in size. Normal mediastinal and hilar contours. Clear lungs.  No pleural effusion or pneumothorax. Skeletal structures are grossly intact. IMPRESSION: No active disease. Electronically Signed   By: Lajean Manes M.D.   On: 07/17/2019 09:08   ECHOCARDIOGRAM COMPLETE  Result Date: 07/17/2019   ECHOCARDIOGRAM REPORT   Patient Name:   RONIN CRAGER Date of Exam: 07/17/2019 Medical Rec #:  [id number]       Height:       66.0 in Accession #:    [id number]      Weight:       107.0 lb Date of Birth:  Aug 19, 1990       BSA:          1.53 m Patient Age:    28 years        BP:           145/97 mmHg Patient Gender: F               HR:           73 bpm. Exam Location:  Inpatient Procedure: 2D Echo Indications:    chest pain  History:        Patient has no prior  history of Echocardiogram examinations.                 Tobacco use.  Sonographer:    Jannett Celestine RDCS (AE) Referring Phys: [place] Comments: restricted mobility (patient unable to straighten left arm so exam performed supine). IMPRESSIONS  1. Left ventricular ejection fraction, by visual estimation, is 60 to 65%. The left ventricle has normal function. There is no left ventricular hypertrophy.  2. The left ventricle has no regional wall motion abnormalities.  3. Global right ventricle has normal systolic function.The right ventricular size is normal. No increase in right ventricular wall thickness.  4. Left atrial size was normal.  5. Right atrial size was normal.  6. The mitral valve is normal in structure. Trivial mitral valve regurgitation. No evidence of mitral stenosis.  7. The tricuspid valve is normal in structure.  8. The tricuspid valve is normal in structure. Tricuspid valve regurgitation is not demonstrated.  9. The aortic valve is normal in structure. Aortic valve regurgitation is not visualized. No evidence of aortic valve sclerosis or stenosis. 10. The pulmonic valve was grossly normal. Pulmonic valve regurgitation is not visualized. 11. The inferior vena cava is normal in size with greater than 50% respiratory variability, suggesting right atrial pressure of 3 mmHg. FINDINGS  Left Ventricle: Left ventricular ejection fraction, by visual estimation, is 60 to 65%. The left ventricle has normal function. The left ventricle has no regional wall motion abnormalities. There is no left ventricular hypertrophy. Left ventricular diastolic parameters were normal. Normal left atrial pressure. Right Ventricle: The right ventricular size is normal. No increase in right ventricular wall thickness. Global RV systolic function is has normal systolic function. Left Atrium: Left atrial size was normal in size. Right Atrium: Right atrial size was normal in size Pericardium: There  is no  evidence of pericardial effusion. Mitral Valve: The mitral valve is normal in structure. Trivial mitral valve regurgitation. No evidence of mitral valve stenosis by observation. Tricuspid Valve: The tricuspid valve is normal in structure. Tricuspid valve regurgitation is not demonstrated. Aortic Valve: The aortic valve is normal in structure. Aortic valve regurgitation is not visualized. The aortic valve is structurally normal, with no evidence of sclerosis or stenosis. Pulmonic Valve: The pulmonic valve was grossly normal. Pulmonic valve regurgitation is not visualized. Pulmonic regurgitation is not visualized. Aorta: The aortic root, ascending aorta and aortic arch are all structurally normal, with no evidence of dilitation or obstruction. Venous: The inferior vena cava is normal in size with greater than 50% respiratory variability, suggesting right atrial pressure of 3 mmHg. IAS/Shunts: No atrial level shunt detected by color flow Doppler. There is no evidence of a patent foramen ovale. No ventricular septal defect is seen or detected. There is no evidence of an atrial septal defect.  LEFT VENTRICLE PLAX 2D LVIDd:         4.40 cm  Diastology LVIDs:         2.90 cm  LV e' lateral:   14.70 cm/s LV PW:         0.90 cm  LV E/e' lateral: 5.2 LV IVS:        0.70 cm  LV e' medial:    7.72 cm/s LVOT diam:     2.00 cm  LV E/e' medial:  9.8 LV SV:         55 ml LV SV Index:   37.21 LVOT Area:     3.14 cm  LEFT ATRIUM           Index LA diam:      2.80 cm 1.83 cm/m LA Vol (A2C): 24.5 ml 15.98 ml/m LA Vol (A4C): 26.9 ml 17.55 ml/m  AORTIC VALVE LVOT Vmax:   91.40 cm/s LVOT Vmean:  65.000 cm/s LVOT VTI:    0.215 m  AORTA Ao Root diam: 2.50 cm MITRAL VALVE MV Area (PHT): 2.66 cm             SHUNTS MV PHT:        82.65 msec           Systemic VTI:  0.22 m MV Decel Time: 285 msec             Systemic Diam: 2.00 cm MV E velocity: 75.90 cm/s 103 cm/s MV A velocity: 74.70 cm/s 70.3 cm/s MV E/A ratio:  1.02       1.5   Jodelle Red MD Electronically signed by Jodelle Red MD Signature Date/Time: 07/17/2019/6:07:11 PM    Final     Cardiac Studies   Echo:  IMPRESSIONS    1. Left ventricular ejection fraction, by visual estimation, is 60 to 65%. The left ventricle has normal function. There is no left ventricular hypertrophy.  2. The left ventricle has no regional wall motion abnormalities.  3. Global right ventricle has normal systolic function.The right ventricular size is normal. No increase in right ventricular wall thickness.  4. Left atrial size was normal.  5. Right atrial size was normal.  6. The mitral valve is normal in structure. Trivial mitral valve regurgitation. No evidence of mitral stenosis.  7. The tricuspid valve is normal in structure.  8. The tricuspid valve is normal in structure. Tricuspid valve regurgitation is not demonstrated.  9. The aortic valve is normal in structure. Aortic valve regurgitation is  not visualized. No evidence of aortic valve sclerosis or stenosis. 10. The pulmonic valve was grossly normal. Pulmonic valve regurgitation is not visualized. 11. The inferior vena cava is normal in size with greater than 50% respiratory variability, suggesting right atrial pressure of 3 mmHg.  Patient Profile     28 y.o. female presents with severe chest pain and elevated troponin  Assessment & Plan    1.NSTEMI. patient had severe recurrent chest pain this am. Troponin elevated to 1444. Ecg with dynamic ST-T changes c/w anterolateral ischemia. 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 Only risk factor is tobacco use. On ASA, statin, beta blocker. Will add IV Heparin. Plan to proceed with cardiac cath this am. The procedure and risks were reviewed including but not limited to death, myocardial infarction, stroke, arrythmias, bleeding, transfusion, emergency surgery, dye allergy, or renal dysfunction. The patient voices understanding and is agreeable to proceed. 2. Tobacco  abuse - counseled on the importance of cessation. She is motivated to quit.   For questions or updates, please contact [facility] Please consult [email or web address] for contact info under        Signed, Ashland Osmer, MD  07/18/2019, 7:27 AM    

## 2019-07-18 NOTE — Interval H&P Note (Signed)
Cath Lab Visit (complete for each Cath Lab visit)  Clinical Evaluation Leading to the Procedure:   ACS: Yes.    Non-ACS:    Anginal Classification: CCS III  Anti-ischemic medical therapy: No Therapy  Non-Invasive Test Results: No non-invasive testing performed  Prior CABG: No previous CABG      History and Physical Interval Note:  07/18/2019 9:05 AM  Janice Esparza  has presented today for surgery, with the diagnosis of nonstemi.  The various methods of treatment have been discussed with the patient and family. After consideration of risks, benefits and other options for treatment, the patient has consented to  Procedure(s): LEFT HEART CATH AND CORONARY ANGIOGRAPHY (N/A) as a surgical intervention.  The patient's history has been reviewed, patient examined, no change in status, stable for surgery.  I have reviewed the patient's chart and labs.  Questions were answered to the patient's satisfaction.     Lyn Records III

## 2019-07-18 NOTE — Progress Notes (Signed)
ANTICOAGULATION CONSULT NOTE - Initial Consult  Pharmacy Consult for heparin Indication: chest pain/ACS  No Known Allergies  Patient Measurements: Height: 5\' 6"  (167.6 cm) Weight: 107 lb (48.5 kg) IBW/kg (Calculated) : 59.3 Heparin Dosing Weight: 48.5 kg  Vital Signs: Temp: 98.6 F (37 C) (01/28 0428) Temp Source: Oral (01/28 0428) BP: 132/73 (01/27 2122) Pulse Rate: 65 (01/27 2122)  Labs: Recent Labs    07/17/19 0834 07/17/19 1036 07/17/19 1226 07/17/19 1757 07/17/19 2340 07/18/19 0253  HGB 12.0  --   --   --   --  12.5  HCT 35.7*  --   --   --   --  37.5  PLT 205  --   --   --   --  190  CREATININE 0.50  --   --   --   --  0.68  TROPONINIHS 176*   < > 563* 1,444* 1,434*  --    < > = values in this interval not displayed.    Estimated Creatinine Clearance: 78.7 mL/min (by C-G formula based on SCr of 0.68 mg/dL).   Medical History: Past Medical History:  Diagnosis Date  . Chest pain 07/17/2019  . Eczema, allergic   . Psoriasis   . Tobacco use     Medications:  Scheduled:  . aspirin  324 mg Oral Once  . atorvastatin  40 mg Oral q1800  . heparin  1,000 Units Intravenous Once  . metoprolol tartrate  12.5 mg Oral BID  . sodium chloride flush  3 mL Intravenous Q12H    Assessment: Janice Esparza found to have chest pain, starting on heparin infusion for ACS. Trop increasing from 563 to 1434. No AC PTA.  Hgb 12.5, plt 190. No s/sx of bleeding.   Goal of Therapy:  Heparin level 0.3-0.7 units/ml Monitor platelets by anticoagulation protocol: Yes   Plan:  Give 1000 units bolus x 1 - reduced given recent subQ heparin administration for DVT ppx Start heparin infusion at 550 units/hr Check anti-Xa level in 6 hours and daily while on heparin Continue to monitor H&H and platelets  07/19/2019, PharmD, BCCCP Clinical Pharmacist  Phone: [phone]  Please check AMION for all [facility] Pharm[facility] phone numbers After 10:00 PM, call Main Pharmacy [phone] 07/18/2019,7:27  AM

## 2019-07-19 ENCOUNTER — Telehealth: Payer: Self-pay | Admitting: Cardiology

## 2019-07-19 DIAGNOSIS — I201 Angina pectoris with documented spasm: Secondary | ICD-10-CM

## 2019-07-19 DIAGNOSIS — F17200 Nicotine dependence, unspecified, uncomplicated: Secondary | ICD-10-CM

## 2019-07-19 MED ORDER — ISOSORBIDE MONONITRATE ER 30 MG PO TB24: 30.0000 mg | ORAL_TABLET | Freq: Every day | ORAL | 3 refills | Status: DC

## 2019-07-19 MED ORDER — CLOPIDOGREL BISULFATE 75 MG PO TABS: 75.0000 mg | ORAL_TABLET | Freq: Every day | ORAL | 6 refills | Status: DC

## 2019-07-19 MED ORDER — DILTIAZEM HCL ER COATED BEADS 120 MG PO CP24: 120.0000 mg | ORAL_CAPSULE | Freq: Every day | ORAL | Status: DC

## 2019-07-19 MED ORDER — ATORVASTATIN CALCIUM 40 MG PO TABS: 40.0000 mg | ORAL_TABLET | Freq: Every day | ORAL | 3 refills | Status: DC

## 2019-07-19 MED ORDER — NITROGLYCERIN 0.4 MG SL SUBL: 0.4000 mg | SUBLINGUAL_TABLET | SUBLINGUAL | 0 refills | Status: DC | PRN

## 2019-07-19 MED ORDER — DILTIAZEM HCL ER COATED BEADS 120 MG PO CP24: 120.0000 mg | ORAL_CAPSULE | Freq: Every day | ORAL | 3 refills | Status: DC

## 2019-07-19 MED ORDER — ASPIRIN EC 81 MG PO TBEC: 81.0000 mg | DELAYED_RELEASE_TABLET | Freq: Every day | ORAL | 11 refills | Status: DC

## 2019-07-19 NOTE — Plan of Care (Signed)
  Problem: Education: Goal: Knowledge of General Education information will improve Description: Including pain rating scale, medication(s)/side effects and non-pharmacologic comfort measures Outcome: Adequate for Discharge   Problem: Health Behavior/Discharge Planning: Goal: Ability to manage health-related needs will improve Outcome: Adequate for Discharge   Problem: Clinical Measurements: Goal: Ability to maintain clinical measurements within normal limits will improve Outcome: Adequate for Discharge   Problem: Clinical Measurements: Goal: Will remain free from infection Outcome: Adequate for Discharge   Problem: Clinical Measurements: Goal: Diagnostic test results will improve Outcome: Adequate for Discharge   Problem: Clinical Measurements: Goal: Respiratory complications will improve Outcome: Adequate for Discharge   Problem: Clinical Measurements: Goal: Cardiovascular complication will be avoided Outcome: Adequate for Discharge   Problem: Clinical Measurements: Goal: Ability to maintain clinical measurements within normal limits will improve Outcome: Adequate for Discharge   Problem: Clinical Measurements: Goal: Will remain free from infection Outcome: Adequate for Discharge   Problem: Clinical Measurements: Goal: Diagnostic test results will improve Outcome: Adequate for Discharge   Problem: Clinical Measurements: Goal: Respiratory complications will improve Outcome: Adequate for Discharge   Problem: Clinical Measurements: Goal: Cardiovascular complication will be avoided Outcome: Adequate for Discharge   Problem: Clinical Measurements: Goal: Ability to maintain clinical measurements within normal limits will improve Outcome: Adequate for Discharge   Problem: Clinical Measurements: Goal: Will remain free from infection Outcome: Adequate for Discharge   Problem: Clinical Measurements: Goal: Diagnostic test results will improve Outcome: Adequate for  Discharge   Problem: Clinical Measurements: Goal: Respiratory complications will improve Outcome: Adequate for Discharge   Problem: Clinical Measurements: Goal: Cardiovascular complication will be avoided Outcome: Adequate for Discharge   Problem: Activity: Goal: Risk for activity intolerance will decrease Outcome: Adequate for Discharge   Problem: Nutrition: Goal: Adequate nutrition will be maintained Outcome: Adequate for Discharge   Problem: Coping: Goal: Level of anxiety will decrease Outcome: Adequate for Discharge   Problem: Elimination: Goal: Will not experience complications related to bowel motility Outcome: Adequate for Discharge   Problem: Elimination: Goal: Will not experience complications related to bowel motility Outcome: Adequate for Discharge   Problem: Elimination: Goal: Will not experience complications related to urinary retention Outcome: Adequate for Discharge   Problem: Pain Managment: Goal: General experience of comfort will improve Outcome: Adequate for Discharge   Problem: Pain Managment: Goal: General experience of comfort will improve Outcome: Adequate for Discharge   Problem: Elimination: Goal: Will not experience complications related to urinary retention Outcome: Adequate for Discharge   Problem: Safety: Goal: Ability to remain free from injury will improve Outcome: Adequate for Discharge   Problem: Safety: Goal: Ability to remain free from injury will improve Outcome: Adequate for Discharge   Problem: Skin Integrity: Goal: Risk for impaired skin integrity will decrease Outcome: Adequate for Discharge   Problem: Skin Integrity: Goal: Risk for impaired skin integrity will decrease Outcome: Adequate for Discharge

## 2019-07-19 NOTE — Discharge Summary (Signed)
Discharge Summary    Patient ID: Janice Esparza MRN: [id number]; DOB: 1991-04-12  Admit date: 07/17/2019 Discharge date: 07/19/2019  Primary Care Provider: Patient, No Pcp Per  Primary Cardiologist: Dr. Peter Swazil[place], MD   Discharge Diagnoses    Principal Problem:   Non-ST elevation (NSTEMI) myocardial infarction [facility]) Active Problems:   TOBACCO USER   Chest pain   Coronary vasospasm [facility])  Diagnostic Studies/Procedures    LHC 07/18/19:   Normal left main  Normal LAD diagonals.  Normal circumflex  Right coronary, with relatively small distal territory.  Proximal RCA ectasia.  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  Normal LV function.  RECOMMENDATIONS:   Clinical diagnosis is MINOCA vs CA spasm vs mild pericarditis.    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  Consider MRI, cardiac. _____________   Echocardiogram 07/17/19:   1. Left ventricular ejection fraction, by visual estimation, is 60 to  65%. The left ventricle has normal function. There is no left ventricular  hypertrophy.  2. The left ventricle has no regional wall motion abnormalities.  3. Global right ventricle has normal systolic function.The right  ventricular size is normal. No increase in right ventricular wall  thickness.  4. Left atrial size was normal.  5. Right atrial size was normal.  6. The mitral valve is normal in structure. Trivial mitral valve  regurgitation. No evidence of mitral stenosis.  7. The tricuspid valve is normal in structure.  8. The tricuspid valve is normal in structure. Tricuspid valve  regurgitation is not demonstrated.  9. The aortic valve is normal in structure. Aortic valve regurgitation is  not visualized. No evidence of aortic valve sclerosis or stenosis.  10. The pulmonic valve was grossly normal. Pulmonic valve regurgitation is  not visualized.  11. The  inferior vena cava is normal in size with greater than 50%  respiratory variability, suggesting right atrial pressure of 3 mmHg.   History of Present Illness     Janice Esparza is a 29 y.o. female with a hx of peritonsilar abscess , + tobacco, + marijuana use  who was seen for the evaluation of chest pain.   Hospital Course     [name] has a hx as stated above who presented to [facility] on 07/17[facility]19 with acute onset of chest pain. She has no PMH of cardiac issues, HTN or diabetes. Her pain was noted to increase with lying down and would improve with sitting up. She had mild nausea but no vomiting. Her breathing was not described as her baseline normal but she denied SOB.   In the ED, WBC and D dimer normal. HS troponin 176>>337. EKG showed NSR with T wave inversion in lead V3, otherwise unchanged from 2016. Sed rate and CRP were also found to be normal. Troponin rose to 563. Initially treated for possible pericarditis however other differentials included coronary vasospasm and SCAD with plan for cath if recurrent chest pain. Echocardiogram 1/27 was found ot be normal.   Unfortunately, she had acute chest pain once again on 07/18/19 therefore LHC was pursued which showed a clinical dx of possible MINOCA versus CA spasm. After close review of cath imaging per Dr. Katrinka Blazing, could possibly have total occlusion of the conus branch with a stump arising from the pRCA. Recommendations were to purse cMRI which was found to be normal without scar or inflammation. No CAD.   She has had  no recurrent chest pain. Dr. SwazilandJordan feels chest pain was secondary to coronary vasospasm therefore her beta blocker was stopped for diltiazem CD 120mg  and IMDUR 15mg  PO QD. She will need to continue ASA and Plavix therapy for now given her cardiac findings.   New medications: ASA, Plavix, diltiazem, IMDUR  Consultants: None   The patient was seen and examined by Dr. SwazilandJordan who feels that she is stable and ready for discharge  today, 07/19/19. Cath site stable.   Did the patient have an acute coronary syndrome (MI, NSTEMI, STEMI, etc) this admission?:  No                               Did the patient have a percutaneous coronary intervention (stent / angioplasty)?:  No.   _____________  Discharge Vitals Blood pressure (!) 124/100, pulse 72, temperature 98.4 F (36.9 C), temperature source Oral, resp. rate 18, height 5\' 6"  (1.676 m), weight 48.3 kg, last menstrual period 07/14/2019, SpO2 100 %.  Filed Weights   07/17/19 0822 07/17/19 1422 07/19/19 0445  Weight: 49.9 kg 48.5 kg 48.3 kg    Labs & Radiologic Studies    CBC Recent Labs    07/17/19 0834 07/18/19 0253  WBC 4.2 3.7*  NEUTROABS 2.1  --   HGB 12.0 12.5  HCT 35.7* 37.5  MCV 92.0 92.1  PLT 205 190   Basic Metabolic Panel Recent Labs    16/03/9600/27/21 0834 07/17/19 1757 07/18/19 0253  NA 136  --  136  K 3.8  --  3.4*  CL 101  --  103  CO2 20*  --  23  GLUCOSE 75  --  80  BUN 8  --  17  CREATININE 0.50  --  0.68  CALCIUM 9.1  --  9.1  MG  --  2.5*  --    Liver Function Tests Recent Labs    07/17/19 1757  AST 42*  ALT 22  ALKPHOS 69  BILITOT 0.8  PROT 8.3*  ALBUMIN 4.1   No results for input(s): LIPASE, AMYLASE in the last 72 hours. High Sensitivity Troponin:   Recent Labs  Lab 07/17/19 0834 07/17/19 1036 07/17/19 1226 07/17/19 1757 07/17/19 2340  TROPONINIHS 176* 337* 563* 1,444* 1,434*    BNP Invalid input(s): POCBNP D-Dimer Recent Labs    07/17/19 0935  DDIMER 0.35   Hemoglobin A1C Recent Labs    07/17/19 1757  HGBA1C 5.2   Fasting Lipid Panel Recent Labs    07/18/19 0253  CHOL 148  HDL 78  LDLCALC 62  TRIG 39  CHOLHDL 1.9   Thyroid Function Tests Recent Labs    07/17/19 1757  TSH 1.716   _____________  CARDIAC CATHETERIZATION  Result Date: 07/18/2019  Normal left main  Normal LAD diagonals.  Normal circumflex  Right coronary, with relatively small distal territory.  Proximal RCA ectasia.   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  Normal LV function. RECOMMENDATIONS:  Clinical diagnosis is MINOCA vs CA spasm vs mild pericarditis.   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  Consider MRI, cardiac.  DG Chest Portable 1 View  Result Date: 07/17/2019 CLINICAL DATA:  Pt from home with c.o central chest pain that started about 1.5 hrs ago after having intercourse. Pt tried laying down to see if it would improve but it only got  worse. Pt admits to drinking alcohol and smoking marijuana last night but nothing more than her usual. EXAM: PORTABLE CHEST 1 VIEW COMPARISON:  10/15/2014 FINDINGS: Cardiac silhouette normal in size. Normal mediastinal and hilar contours. Clear lungs.  No pleural effusion or pneumothorax. Skeletal structures are grossly intact. IMPRESSION: No active disease. Electronically Signed   By: Amie Portlandavid  Ormond M.D.   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 Differential diagnosis is MINOCA vs CA spasm vs acute pericarditis. EXAM: CARDIAC MRI TECHNIQUE: The patient was scanned on a 1.5 Tesla GE magnet. A dedicated cardiac coil was used. Functional imaging was done using Fiesta sequences. 2,3, and 4 chamber views were done to assess for RWMA's. Modified Simpson's rule using a short axis stack was used to calculate an ejection fraction on a dedicated work Research officer, trade [employer]station using Circle software. The patient received 8 cc of Gadavist. After 10 minutes inversion recovery sequences were used to assess for infiltration and scar tissue. CONTRAST:  8 cc  of Gadavist FINDINGS: 1. Normal left ventricular size, thickness and systolic function (LVEF = 58%). There are no regional wall motion abnormalities  and no late gadolinium enhancement in the left ventricular myocardium. LVEDD: 49 mm LVESD: 38 mm LVEDV: 151 ml LVESV: 63 ml SV: 88 ml CO: 5.1 L/min Myocardial mass: 104 g 2. Normal right ventricular size, thickness and systolic function (LVEF = 49%). There are no regional wall motion abnormalities. 3.  Normal left and right atrial size. 4. Normal size of the aortic root, ascending aorta and pulmonary artery. 5. No significant valvular abnormalities. Trivial mitral regurgitation. 6.  Normal pericardium.  Trivial pericardial effusion. IMPRESSION: 1. Normal left ventricular size, thickness and systolic function (LVEF = 58%). There are no regional wall motion abnormalities and no late gadolinium enhancement in the left ventricular myocardium. 2. Normal right ventricular size, thickness and systolic function (LVEF = 49%). There are no regional wall motion abnormalities. 3.  Normal left and right atrial size. 4. Normal size of the aortic root, ascending aorta and pulmonary artery. 5. No significant valvular abnormalities. Trivial mitral regurgitation. 6.  Normal pericardium.  Trivial pericardial effusion. Normal cardiac MRI, there is no evidence for myocardial edema or scarring, no evidence for pericarditis, troponin elevation possibly represents a coronary spasm. Electronically Signed   By: Tobias AlexanderKatarina  Nelson   On: 07/19/2019 00:37   ECHOCARDIOGRAM COMPLETE  Result Date: 07/17/2019   ECHOCARDIOGRAM REPORT   Patient Name:   Talbert NanVINNIE E Bomba Date of Exam: 07/17/2019 Medical Rec #:  [id number]       Height:       66.0 in Accession #:    [id number]      Weight:       107.0 lb Date of Birth:  [birth year]       BSA:          1.53 m Patient Age:    28 years        BP:           145/97 mmHg Patient Gender: F               HR:           73 bpm. Exam Location:  Inpatient Procedure: 2D Echo Indications:    chest pain  History:  Patient has no prior history of Echocardiogram examinations.                 Tobacco use.   Sonographer:    Celene Skeen RDCS (AE) Referring Phys: [id number] LAURA R INGOLD  Sonographer Comments: restricted mobility (patient unable to straighten left arm so exam performed supine). IMPRESSIONS  1. Left ventricular ejection fraction, by visual estimation, is 60 to 65%. The left ventricle has normal function. There is no left ventricular hypertrophy.  2. The left ventricle has no regional wall motion abnormalities.  3. Global right ventricle has normal systolic function.The right ventricular size is normal. No increase in right ventricular wall thickness.  4. Left atrial size was normal.  5. Right atrial size was normal.  6. The mitral valve is normal in structure. Trivial mitral valve regurgitation. No evidence of mitral stenosis.  7. The tricuspid valve is normal in structure.  8. The tricuspid valve is normal in structure. Tricuspid valve regurgitation is not demonstrated.  9. The aortic valve is normal in structure. Aortic valve regurgitation is not visualized. No evidence of aortic valve sclerosis or stenosis. 10. The pulmonic valve was grossly normal. Pulmonic valve regurgitation is not visualized. 11. The inferior vena cava is normal in size with greater than 50% respiratory variability, suggesting right atrial pressure of 3 mmHg. FINDINGS  Left Ventricle: Left ventricular ejection fraction, by visual estimation, is 60 to 65%. The left ventricle has normal function. The left ventricle has no regional wall motion abnormalities. There is no left ventricular hypertrophy. Left ventricular diastolic parameters were normal. Normal left atrial pressure. Right Ventricle: The right ventricular size is normal. No increase in right ventricular wall thickness. Global RV systolic function is has normal systolic function. Left Atrium: Left atrial size was normal in size. Right Atrium: Right atrial size was normal in size Pericardium: There is no evidence of pericardial effusion. Mitral Valve: The mitral valve is normal in  structure. Trivial mitral valve regurgitation. No evidence of mitral valve stenosis by observation. Tricuspid Valve: The tricuspid valve is normal in structure. Tricuspid valve regurgitation is not demonstrated. Aortic Valve: The aortic valve is normal in structure. Aortic valve regurgitation is not visualized. The aortic valve is structurally normal, with no evidence of sclerosis or stenosis. Pulmonic Valve: The pulmonic valve was grossly normal. Pulmonic valve regurgitation is not visualized. Pulmonic regurgitation is not visualized. Aorta: The aortic root, ascending aorta and aortic arch are all structurally normal, with no evidence of dilitation or obstruction. Venous: The inferior vena cava is normal in size with greater than 50% respiratory variability, suggesting right atrial pressure of 3 mmHg. IAS/Shunts: No atrial level shunt detected by color flow Doppler. There is no evidence of a patent foramen ovale. No ventricular septal defect is seen or detected. There is no evidence of an atrial septal defect.  LEFT VENTRICLE PLAX 2D LVIDd:         4.40 cm  Diastology LVIDs:         2.90 cm  LV e' lateral:   14.70 cm/s LV PW:         0.90 cm  LV E/e' lateral: 5.2 LV IVS:        0.70 cm  LV e' medial:    7.72 cm/s LVOT diam:     2.00 cm  LV E/e' medial:  9.8 LV SV:         55 ml LV SV Index:   37.21 LVOT Area:     3.14  cm  LEFT ATRIUM           Index LA diam:      2.80 cm 1.83 cm/m LA Vol (A2C): 24.5 ml 15.98 ml/m LA Vol (A4C): 26.9 ml 17.55 ml/m  AORTIC VALVE LVOT Vmax:   91.40 cm/s LVOT Vmean:  65.000 cm/s LVOT VTI:    0.215 m  AORTA Ao Root diam: 2.50 cm MITRAL VALVE MV Area (PHT): 2.66 cm             SHUNTS MV PHT:        82.65 msec           Systemic VTI:  0.22 m MV Decel Time: 285 msec             Systemic Diam: 2.00 cm MV E velocity: 75.90 cm/s 103 cm/s MV A velocity: 74.70 cm/s 70.3 cm/s MV E/A ratio:  1.02       1.5  Buford Dresser MD Electronically signed by Buford Dresser MD Signature  Date/Time: 07/17/2019/6:07:11 PM    Final    Disposition   Pt is being discharged home today in good condition.  Follow-up Plans & Appointments    Follow-up Information    Erlene Quan, PA-C Follow up on 07/24/2019.   Specialties: Cardiology, Radiology Why: Your follow up appointment will be on 07/24/19 at 215pm. Please arrive at 200pm.  Contact information: [address] [place] [address] [phone]          Discharge Instructions    Call MD for:  difficulty breathing, headache or visual disturbances   Complete by: As directed    Call MD for:  extreme fatigue   Complete by: As directed    Call MD for:  hives   Complete by: As directed    Call MD for:  persistant dizziness or light-headedness   Complete by: As directed    Call MD for:  persistant nausea and vomiting   Complete by: As directed    Call MD for:  redness, tenderness, or signs of infection (pain, swelling, redness, odor or green/yellow discharge around incision site)   Complete by: As directed    Call MD for:  severe uncontrolled pain   Complete by: As directed    Call MD for:  temperature >100.4   Complete by: As directed    Diet - low sodium heart healthy   Complete by: As directed    Discharge instructions   Complete by: As directed    No driving for 3-5 days. No lifting over 5 lbs for 1 week. No sexual activity for 1 week. Keep procedure site clean & dry. If you notice increased pain, swelling, bleeding or pus, call/return!  You may shower, but no soaking baths/hot tubs/pools for 1 week.   Increase activity slowly   Complete by: As directed      Discharge Medications   Allergies as of 07/19/2019   No Known Allergies     Medication List    TAKE these medications   aspirin EC 81 MG tablet Take 1 tablet (81 mg total) by mouth daily. Start taking on: July 20, 2019   atorvastatin 40 MG tablet Commonly known as: LIPITOR Take 1 tablet (40 mg total) by mouth daily at 6 PM.     clopidogrel 75 MG tablet Commonly known as: PLAVIX Take 1 tablet (75 mg total) by mouth daily. Start taking on: July 20, 2019   diltiazem 120 MG 24 hr capsule Commonly known as:  CARDIZEM CD Take 1 capsule (120 mg total) by mouth daily. Start taking on: July 20, 2019   isosorbide mononitrate 30 MG 24 hr tablet Commonly known as: IMDUR Take 1 tablet (30 mg total) by mouth daily. Start taking on: July 20, 2019   nitroGLYCERIN 0.4 MG SL tablet Commonly known as: NITROSTAT Place 1 tablet (0.4 mg total) under the tongue every 5 (five) minutes x 3 doses as needed for chest pain.       Outstanding Labs/Studies   None   Duration of Discharge Encounter   Greater than 30 minutes including physician time.  Signed, Georgie Chard, NP 07/19/2019, 11:13 AM

## 2019-07-19 NOTE — Telephone Encounter (Signed)
Per Georgie Chard, patient will see Corine Shelter on 07/24/19 at 2:15pm

## 2019-07-19 NOTE — Telephone Encounter (Signed)
Spoke to patient she stated she wants to change appointment scheduled on 2/3 with Corine Shelter PA.Appointment changed to 07/23/19 at 3:15 pm with Corine Shelter PA.

## 2019-07-19 NOTE — Progress Notes (Signed)
NURSING PROGRESS NOTE  Janice Esparza [id number] Discharge Data: 07/19/2019 12:37 PM Attending Provider: [place], Peter M, MD BRA:[id number], No Pcp Per     Talbert Nan to be D/C'd Home per MD order.  Discussed with the patient the After Visit Summary and all questions fully answered. All IV's discontinued with no bleeding noted. Pt given a physician's note on when to return to work. All belongings returned to patient for patient to take home. Pt refused to get bp rechecked prior to discharge.Pt taken downstairs via wheelchair accompanied by a staff member.  Last Vital Signs:  Blood pressure (!) 124/100, pulse 72, temperature 98.4 F (36.9 C), temperature source Oral, resp. rate 18, height 5\' 6"  (1.676 m), weight 48.3 kg, last menstrual period 07/14/2019, SpO2 100 %.  Discharge Medication List Allergies as of 07/19/2019   No Known Allergies     Medication List    TAKE these medications   aspirin EC 81 MG tablet Take 1 tablet (81 mg total) by mouth daily. Start taking on: July 20, 2019   atorvastatin 40 MG tablet Commonly known as: LIPITOR Take 1 tablet (40 mg total) by mouth daily at 6 PM.   clopidogrel 75 MG tablet Commonly known as: PLAVIX Take 1 tablet (75 mg total) by mouth daily. Start taking on: July 20, 2019   diltiazem 120 MG 24 hr capsule Commonly known as: CARDIZEM CD Take 1 capsule (120 mg total) by mouth daily. Start taking on: July 20, 2019   isosorbide mononitrate 30 MG 24 hr tablet Commonly known as: IMDUR Take 1 tablet (30 mg total) by mouth daily. Start taking on: July 20, 2019   nitroGLYCERIN 0.4 MG SL tablet Commonly known as: NITROSTAT Place 1 tablet (0.4 mg total) under the tongue every 5 (five) minutes x 3 doses as needed for chest pain.

## 2019-07-19 NOTE — Progress Notes (Signed)
Progress Note  Patient Name: Janice Esparza Date of Encounter: 07/19/2019  [facility] Cardiologist[facility]   Subjective   Denies recurrent symptoms. Anticipate d/c today with close follow up.   Inpatient Medications    Scheduled Meds:  aspirin  81 mg Oral Daily   atorvastatin  40 mg Oral q1800   clopidogrel  75 mg Oral Daily   heparin  1,000 Units Intravenous Once   isosorbide mononitrate  30 mg Oral Daily   metoprolol tartrate  25 mg Oral BID   sodium chloride flush  3 mL Intravenous Q12H   sodium chloride flush  3 mL Intravenous Q12H   Continuous Infusions:  sodium chloride     sodium chloride     PRN Meds: sodium chloride, sodium chloride, acetaminophen, acetaminophen, ALPRAZolam, influenza vac split quadrivalent PF, nitroGLYCERIN, ondansetron (ZOFRAN) IV, ondansetron (ZOFRAN) IV, oxyCODONE, sodium chloride flush, sodium chloride flush   Vital Signs    Vitals:   07/18/19 1439 07/18/19 1539 07/18/19 2026 07/19/19 0445  BP: 121/89 108/86 116/89 120/85  Pulse: 66 60 70 60  Resp:   16 18  Temp:   97.8 F (36.6 C) 98.4 F (36.9 C)  TempSrc:   Oral Oral  SpO2: 100% 99% 100% 100%  Weight:    48.3 kg  Height:        Intake/Output Summary (Last 24 hours) at 07/19/2019 0717 Last data filed at 07/18/2019 1616 Gross per 24 hour  Intake 578.21 ml  Output --  Net 578.21 ml   Filed Weights   07/17/19 0822 07/17/19 1422 07/19/19 0445  Weight: 49.9 kg 48.5 kg 48.3 kg    Physical Exam   General: Well developed, well nourished, NAD Neck: Negative for carotid bruits. No JVD Lungs:Clear to ausculation bilaterally. No wheezes, rales, or rhonchi. Breathing is unlabored. Cardiovascular: RRR with S1 S2. No murmurs Abdomen: Soft, non-tender, non-distended. No obvious abdominal masses. Extremities: No edema. DP/PT pulses 2+ bilaterally Neuro: Alert and oriented. No focal deficits. No facial asymmetry. MAE spontaneously. Psych: Responds to questions  appropriately with normal affect.    Labs    Chemistry Recent Labs  Lab 07/17/19 0834 07/17/19 1757 07/18/19 0253  NA 136  --  136  K 3.8  --  3.4*  CL 101  --  103  CO2 20*  --  23  GLUCOSE 75  --  80  BUN 8  --  17  CREATININE 0.50  --  0.68  CALCIUM 9.1  --  9.1  PROT  --  8.3*  --   ALBUMIN  --  4.1  --   AST  --  42*  --   ALT  --  22  --   ALKPHOS  --  69  --   BILITOT  --  0.8  --   GFRNONAA >60  --  >60  GFRAA >60  --  >60  ANIONGAP 15  --  10     Hematology Recent Labs  Lab 07/17/19 0834 07/18/19 0253  WBC 4.2 3.7*  RBC 3.88 4.07  HGB 12.0 12.5  HCT 35.7* 37.5  MCV 92.0 92.1  MCH 30.9 30.7  MCHC 33.6 33.3  RDW 12.3 12.1  PLT 205 190    Cardiac EnzymesNo results for input(s): TROPONINI in the last 168 hours. No results for input(s): TROPIPOC in the last 168 hours.   BNPNo results for input(s): BNP, PROBNP in the last 168 hours.   DDimer  Recent Labs  Lab 07/17/19 0935  DDIMER 0.35     Radiology    CARDIAC CATHETERIZATION  Result Date: 07/18/2019  Normal left main  Normal LAD diagonals.  Normal circumflex  Right coronary, with relatively small distal territory.  Proximal RCA ectasia.  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  Normal LV function. RECOMMENDATIONS:  Clinical diagnosis is MINOCA vs CA spasm vs mild pericarditis.   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  Consider MRI, cardiac.  DG Chest Portable 1 View  Result Date: 07/17/2019 CLINICAL DATA:  Pt from home with c.o central chest pain that started about 1.5 hrs ago after having intercourse. Pt tried laying down to see if it would improve but it only got worse. Pt admits to drinking alcohol and smoking marijuana last night but nothing more than her usual. EXAM: PORTABLE CHEST 1 VIEW COMPARISON:  10/15/2014 FINDINGS: Cardiac silhouette normal in size. Normal mediastinal and hilar contours.  Clear lungs.  No pleural effusion or pneumothorax. Skeletal structures are grossly intact. IMPRESSION: No active disease. Electronically Signed   By: Lajean Manes M.D.   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 Differential diagnosis is MINOCA vs CA spasm vs acute pericarditis. EXAM: CARDIAC MRI TECHNIQUE: The patient was scanned on a 1.5 Tesla GE magnet. A dedicated cardiac coil was used. Functional imaging was done using Fiesta sequences. 2,3, and 4 chamber views were done to assess for RWMA's. Modified Simpson's rule using a short axis stack was used to calculate an ejection fraction on a dedicated work Conservation officer, nature. The patient received 8 cc of Gadavist. After 10 minutes inversion recovery sequences were used to assess for infiltration and scar tissue. CONTRAST:  8 cc  of Gadavist FINDINGS: 1. Normal left ventricular size, thickness and systolic function (LVEF = 58%). There are no regional wall motion abnormalities and no late gadolinium enhancement in the left ventricular myocardium. LVEDD: 49 mm LVESD: 38 mm LVEDV: 151 ml LVESV: 63 ml SV: 88 ml CO: 5.1 L/min Myocardial mass: 104 g 2. Normal right ventricular size, thickness and systolic function (LVEF = 49%). There are no regional wall motion abnormalities. 3.  Normal left and right atrial size. 4. Normal size of the aortic root, ascending aorta and pulmonary artery. 5. No significant valvular abnormalities. Trivial mitral regurgitation. 6.  Normal pericardium.  Trivial pericardial effusion. IMPRESSION: 1. Normal left ventricular size, thickness and systolic function (LVEF = 58%). There are no regional wall motion abnormalities and no late gadolinium enhancement in the left ventricular myocardium. 2. Normal right ventricular  size, thickness and systolic function (LVEF = 49%). There are no regional wall motion abnormalities. 3.  Normal left and right atrial size. 4. Normal size of the aortic root, ascending aorta and pulmonary artery. 5. No significant valvular abnormalities. Trivial mitral regurgitation. 6.  Normal pericardium.  Trivial pericardial effusion. Normal cardiac MRI, there is no evidence for myocardial edema or scarring, no evidence for pericarditis, troponin elevation possibly represents a coronary spasm. Electronically Signed   By: Ena Dawley   On: 07/19/2019 00:37   ECHOCARDIOGRAM COMPLETE  Result Date: 07/17/2019   ECHOCARDIOGRAM REPORT   Patient Name:   Linde Gillis Date of Exam:  07/17/2019 Medical Rec #:  [id number]       Height:       66.0 in Accession #:    [id number]      Weight:       107.0 lb Date of Birth:  May 21, 1991       BSA:          1.53 m Patient Age:    28 years        BP:           145/97 mmHg Patient Gender: F               HR:           73 bpm. Exam Location:  Inpatient Procedure: 2D Echo Indications:    chest pain  History:        Patient has no prior history of Echocardiogram examinations.                 Tobacco use.  Sonographer:    Celene Skeen RDCS (AE) Referring Phys: [id number] LAURA R INGOLD  Sonographer Comments: restricted mobility (patient unable to straighten left arm so exam performed supine). IMPRESSIONS  1. Left ventricular ejection fraction, by visual estimation, is 60 to 65%. The left ventricle has normal function. There is no left ventricular hypertrophy.  2. The left ventricle has no regional wall motion abnormalities.  3. Global right ventricle has normal systolic function.The right ventricular size is normal. No increase in right ventricular wall thickness.  4. Left atrial size was normal.  5. Right atrial size was normal.  6. The mitral valve is normal in structure. Trivial mitral valve regurgitation. No evidence of mitral stenosis.  7. The tricuspid valve is normal in  structure.  8. The tricuspid valve is normal in structure. Tricuspid valve regurgitation is not demonstrated.  9. The aortic valve is normal in structure. Aortic valve regurgitation is not visualized. No evidence of aortic valve sclerosis or stenosis. 10. The pulmonic valve was grossly normal. Pulmonic valve regurgitation is not visualized. 11. The inferior vena cava is normal in size with greater than 50% respiratory variability, suggesting right atrial pressure of 3 mmHg. FINDINGS  Left Ventricle: Left ventricular ejection fraction, by visual estimation, is 60 to 65%. The left ventricle has normal function. The left ventricle has no regional wall motion abnormalities. There is no left ventricular hypertrophy. Left ventricular diastolic parameters were normal. Normal left atrial pressure. Right Ventricle: The right ventricular size is normal. No increase in right ventricular wall thickness. Global RV systolic function is has normal systolic function. Left Atrium: Left atrial size was normal in size. Right Atrium: Right atrial size was normal in size Pericardium: There is no evidence of pericardial effusion. Mitral Valve: The mitral valve is normal in structure. Trivial mitral valve regurgitation. No evidence of mitral valve stenosis by observation. Tricuspid Valve: The tricuspid valve is normal in structure. Tricuspid valve regurgitation is not demonstrated. Aortic Valve: The aortic valve is normal in structure. Aortic valve regurgitation is not visualized. The aortic valve is structurally normal, with no evidence of sclerosis or stenosis. Pulmonic Valve: The pulmonic valve was grossly normal. Pulmonic valve regurgitation is not visualized. Pulmonic regurgitation is not visualized. Aorta: The aortic root, ascending aorta and aortic arch are all structurally normal, with no evidence of dilitation or obstruction. Venous: The inferior vena cava is normal in size with greater than 50% respiratory variability,  suggesting right atrial pressure of 3 mmHg. IAS/Shunts: No atrial level shunt detected  by color flow Doppler. There is no evidence of a patent foramen ovale. No ventricular septal defect is seen or detected. There is no evidence of an atrial septal defect.  LEFT VENTRICLE PLAX 2D LVIDd:         4.40 cm  Diastology LVIDs:         2.90 cm  LV e' lateral:   14.70 cm/s LV PW:         0.90 cm  LV E/e' lateral: 5.2 LV IVS:        0.70 cm  LV e' medial:    7.72 cm/s LVOT diam:     2.00 cm  LV E/e' medial:  9.8 LV SV:         55 ml LV SV Index:   37.21 LVOT Area:     3.14 cm  LEFT ATRIUM           Index LA diam:      2.80 cm 1.83 cm/m LA Vol (A2C): 24.5 ml 15.98 ml/m LA Vol (A4C): 26.9 ml 17.55 ml/m  AORTIC VALVE LVOT Vmax:   91.40 cm/s LVOT Vmean:  65.000 cm/s LVOT VTI:    0.215 m  AORTA Ao Root diam: 2.50 cm MITRAL VALVE MV Area (PHT): 2.66 cm             SHUNTS MV PHT:        82.65 msec           Systemic VTI:  0.22 m MV Decel Time: 285 msec             Systemic Diam: 2.00 cm MV E velocity: 75.90 cm/s 103 cm/s MV A velocity: 74.70 cm/s 70.3 cm/s MV E/A ratio:  1.02       1.5  Jodelle Red MD Electronically signed by Jodelle Red MD Signature Date/Time: 07/17/2019/6:07:11 PM    Final    Telemetry    07/19/19 NSR with HR 60's- Personally Reviewed  ECG    No new tracing as of 07/19/19- Personally Reviewed  Cardiac Studies   Echo:  1. Left ventricular ejection fraction, by visual estimation, is 60 to 65%. The left ventricle has normal function. There is no left ventricular hypertrophy. 2. The left ventricle has no regional wall motion abnormalities. 3. Global right ventricle has normal systolic function.The right ventricular size is normal. No increase in right ventricular wall thickness. 4. Left atrial size was normal. 5. Right atrial size was normal. 6. The mitral valve is normal in structure. Trivial mitral valve regurgitation. No evidence of mitral stenosis. 7. The tricuspid  valve is normal in structure. 8. The tricuspid valve is normal in structure. Tricuspid valve regurgitation is not demonstrated. 9. The aortic valve is normal in structure. Aortic valve regurgitation is not visualized. No evidence of aortic valve sclerosis or stenosis. 10. The pulmonic valve was grossly normal. Pulmonic valve regurgitation is not visualized. 11. The inferior vena cava is normal in size with greater than 50% respiratory variability, suggesting right atrial pressure of 3 mmHg.  LHC 07/18/19:   Normal left main  Normal LAD diagonals.  Normal circumflex  Right coronary, with relatively small distal territory.  Proximal RCA ectasia.  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  Normal LV function.  RECOMMENDATIONS:   Clinical diagnosis is MINOCA vs CA spasm vs mild pericarditis.    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  Consider MRI,  cardiac.  Patient Profile     29 y.o. female who presented with severe chest pain and elevated HsT>>cath with MINOCA versus coronary artery spasm with recommendations for cardiac MRI. See above  Assessment & Plan    1. NSTEMI: Pt presented with severe chest pain forund to have elevated HsT>>LHC 07/18/19 with MINOCA versus coronary artery vasospasm>>further review of imaging with possible total occulsion of conus branch with a stump arising from pRCA>>>cMRI performed 07/18/19 with no evidence for myocardial edema or scarring, no evidence for pericarditis, troponin elevation possibly represents a coronary spasm. -Continue ASA, statin, IMDUR 30, metoprolol 25 -Consider amlodipine in the OP setting if BP stable   2. Tobacco Use: -Cessation strongly encouraged   Signed, Georgie Chard NP-C [facility] Pager: [phone] 07/19/2019, 7:17 AM     For questions or updates, please contact   Please consult [email or web address] for contact info under  Cardiology/STEMI.

## 2019-07-19 NOTE — Care Management (Signed)
1226 07-19-19 Hospital follow up appointment established at the [facility] and [facility] Patient will be able to use the onsite pharmacy for medications that range in cost from $4.00-$10.00. No further needs from Case Manager at this time. Gala Lewandowsky, RN,BSN Case Manager

## 2019-07-19 NOTE — Discharge Instructions (Signed)
Information about your medication: Plavix (anti-platelet agent) ° °Generic Name (Brand): clopidogrel (Plavix), once daily medication ° °PURPOSE: You are taking this medication along with aspirin to lower your chance of having a heart attack, stroke, or blood clots in your heart stent. These can be fatal. Brilinta and aspirin help prevent platelets from sticking together and forming a clot that can block an artery or your stent.  ° °Common SIDE EFFECTS you may experience include: bruising or bleeding more easily, shortness of breath ° °Do not stop taking PLAVIX without talking to the doctor who prescribes it for you. People who are treated with a stent and stop taking Plavix too soon, have a higher risk of getting a blood clot in the stent, having a heart attack, or dying. If you stop Plavix because of bleeding, or for other reasons, your risk of a heart attack or stroke may increase.  ° °Tell all of your doctors and dentists that you are taking Plavix. They should talk to the doctor who prescribed plavix for you before you have any surgery or invasive procedure.  ° °Contact your health care provider if you experience: severe or uncontrollable bleeding, pink/red/brown urine, vomiting blood or vomit that looks like "coffee grounds", red or black stools (looks like tar), coughing up blood or blood clots °---------------------------------------------------------------------------------------------------------------------- ° °

## 2019-07-23 ENCOUNTER — Ambulatory Visit (INDEPENDENT_AMBULATORY_CARE_PROVIDER_SITE_OTHER): Payer: Self-pay | Admitting: Cardiology

## 2019-07-23 ENCOUNTER — Encounter: Payer: Self-pay | Admitting: Cardiology

## 2019-07-23 ENCOUNTER — Other Ambulatory Visit: Payer: Self-pay

## 2019-07-23 VITALS — BP 102/70 | HR 78 | Temp 97.9°F | Ht 66.0 in | Wt 113.0 lb

## 2019-07-23 DIAGNOSIS — I214 Non-ST elevation (NSTEMI) myocardial infarction: Secondary | ICD-10-CM

## 2019-07-23 DIAGNOSIS — I2510 Atherosclerotic heart disease of native coronary artery without angina pectoris: Secondary | ICD-10-CM

## 2019-07-23 NOTE — Patient Instructions (Addendum)
Medication Instructions:  Your physician recommends that you continue on your current medications as directed. Please refer to the Current Medication list given to you today. *If you need a refill on your cardiac medications before your next appointment, please call your pharmacy*  Lab Work: None  If you have labs (blood work) drawn today and your tests are completely normal, you will receive your results only by: Marland Kitchen MyChart Message (if you have MyChart) OR . A paper copy in the mail If you have any lab test that is abnormal or we need to change your treatment, we will call you to review the results.  Testing/Procedures: None   Follow-Up: At [facility], you and your health needs are our priority.  As part of our continuing mission to provide you with exceptional heart care, we have created designated Provider Care Teams.  These Care Teams include your primary Cardiologist (physician) and Advanced Practice Providers (APPs -  Physician Assistants and Nurse Practitioners) who all work together to provide you with the care you need, when you need it.  Your next appointment:   3 month(s)  The format for your next appointment:   In Person  Provider:   Peter [place], MD  Other Instructions    Heart-Healthy Eating Plan Heart-healthy meal planning includes:  Eating less unhealthy fats.  Eating more healthy fats.  Making other changes in your diet. Talk with your doctor or a diet specialist (dietitian) to create an eating plan that is right for you. What is my plan? Your doctor may recommend an eating plan that includes:  Total fat: ______% or less of total calories a day.  Saturated fat: ______% or less of total calories a day.  Cholesterol: less than _________mg a day. What are tips for following this plan? Cooking Avoid frying your food. Try to bake, boil, grill, or broil it instead. You can also reduce fat by:  Removing the skin from poultry.  Removing all visible  fats from meats.  Steaming vegetables in water or broth. Meal planning   At meals, divide your plate into four equal parts: ? Fill one-half of your plate with vegetables and green salads. ? Fill one-fourth of your plate with whole grains. ? Fill one-fourth of your plate with lean protein foods.  Eat 4-5 servings of vegetables per day. A serving of vegetables is: ? 1 cup of raw or cooked vegetables. ? 2 cups of raw leafy greens.  Eat 4-5 servings of fruit per day. A serving of fruit is: ? 1 medium whole fruit. ?  cup of dried fruit. ?  cup of fresh, frozen, or canned fruit. ?  cup of 100% fruit juice.  Eat more foods that have soluble fiber. These are apples, broccoli, carrots, beans, peas, and barley. Try to get 20-30 g of fiber per day.  Eat 4-5 servings of nuts, legumes, and seeds per week: ? 1 serving of dried beans or legumes equals  cup after being cooked. ? 1 serving of nuts is  cup. ? 1 serving of seeds equals 1 tablespoon. General information  Eat more home-cooked food. Eat less restaurant, buffet, and fast food.  Limit or avoid alcohol.  Limit foods that are high in starch and sugar.  Avoid fried foods.  Lose weight if you are overweight.  Keep track of how much salt (sodium) you eat. This is important if you have high blood pressure. Ask your doctor to tell you more about this.  Try to add vegetarian meals  each week. Fats  Choose healthy fats. These include olive oil and canola oil, flaxseeds, walnuts, almonds, and seeds.  Eat more omega-3 fats. These include salmon, mackerel, sardines, tuna, flaxseed oil, and ground flaxseeds. Try to eat fish at least 2 times each week.  Check food labels. Avoid foods with trans fats or high amounts of saturated fat.  Limit saturated fats. ? These are often found in animal products, such as meats, butter, and cream. ? These are also found in plant foods, such as palm oil, palm kernel oil, and coconut oil.  Avoid  foods with partially hydrogenated oils in them. These have trans fats. Examples are stick margarine, some tub margarines, cookies, crackers, and other baked goods. What foods can I eat? Fruits All fresh, canned (in natural juice), or frozen fruits. Vegetables Fresh or frozen vegetables (raw, steamed, roasted, or grilled). Green salads. Grains Most grains. Choose whole wheat and whole grains most of the time. Rice and pasta, including brown rice and pastas made with whole wheat. Meats and other proteins Lean, well-trimmed beef, veal, pork, and lamb. Chicken and [place] without skin. All fish and shellfish. Wild duck, rabbit, pheasant, and venison. Egg whites or low-cholesterol egg substitutes. Dried beans, peas, lentils, and tofu. Seeds and most nuts. Dairy Low-fat or nonfat cheeses, including ricotta and mozzarella. Skim or 1% milk that is liquid, powdered, or evaporated. Buttermilk that is made with low-fat milk. Nonfat or low-fat yogurt. Fats and oils Non-hydrogenated (trans-free) margarines. Vegetable oils, including soybean, sesame, sunflower, olive, peanut, safflower, corn, canola, and cottonseed. Salad dressings or mayonnaise made with a vegetable oil. Beverages Mineral water. Coffee and tea. Diet carbonated beverages. Sweets and desserts Sherbet, gelatin, and fruit ice. Small amounts of dark chocolate. Limit all sweets and desserts. Seasonings and condiments All seasonings and condiments. The items listed above may not be a complete list of foods and drinks you can eat. Contact a dietitian for more options. What foods should I avoid? Fruits Canned fruit in heavy syrup. Fruit in cream or butter sauce. Fried fruit. Limit coconut. Vegetables Vegetables cooked in cheese, cream, or butter sauce. Fried vegetables. Grains Breads that are made with saturated or trans fats, oils, or whole milk. Croissants. Sweet rolls. Donuts. High-fat crackers, such as cheese crackers. Meats and other  proteins Fatty meats, such as hot dogs, ribs, sausage, bacon, rib-eye roast or steak. High-fat deli meats, such as salami and bologna. Caviar. Domestic duck and goose. Organ meats, such as liver. Dairy Cream, sour cream, cream cheese, and creamed cottage cheese. Whole-milk cheeses. Whole or 2% milk that is liquid, evaporated, or condensed. Whole buttermilk. Cream sauce or high-fat cheese sauce. Yogurt that is made from whole milk. Fats and oils Meat fat, or shortening. Cocoa butter, hydrogenated oils, palm oil, coconut oil, palm kernel oil. Solid fats and shortenings, including bacon fat, salt pork, lard, and butter. Nondairy cream substitutes. Salad dressings with cheese or sour cream. Beverages Regular sodas and juice drinks with added sugar. Sweets and desserts Frosting. Pudding. Cookies. Cakes. Pies. Milk chocolate or white chocolate. Buttered syrups. Full-fat ice cream or ice cream drinks. The items listed above may not be a complete list of foods and drinks to avoid. Contact a dietitian for more information. Summary  Heart-healthy meal planning includes eating less unhealthy fats, eating more healthy fats, and making other changes in your diet.  Eat a balanced diet. This includes fruits and vegetables, low-fat or nonfat dairy, lean protein, nuts and legumes, whole grains, and heart-healthy oils and  fats. This information is not intended to replace advice given to you by your health care provider. Make sure you discuss any questions you have with your health care provider. Document Revised: 08/10/2017 Document Reviewed: 07/14/2017 Elsevier Patient Education  2020 [employer].

## 2019-07-23 NOTE — Assessment & Plan Note (Signed)
Admitted 07/17/2019 with NSTEMI- Troponin peak 1444

## 2019-07-23 NOTE — Assessment & Plan Note (Signed)
Suspected occlusion of conus branch-vs coronary spasm- otherwise normal coronaries and normal LVF

## 2019-07-23 NOTE — Progress Notes (Signed)
Cardiology Office Note:    Date:  07/23/2019   ID:  Janice Esparza, DOB [birth year], MRN [id number]  PCP:  Patient, No Pcp Per  Cardiologist:  Dr Swaziland Electrophysiologist:  None   Referring MD: No ref. provider found   No chief complaint on file.   History of Present Illness:    Janice Esparza is a 29 y.o. female with no hx of CAD or HTN, presented to [facility] 07/17/2019 with SSCP "pressure".  Troponin was elevated- peak 1444.  She had a normal echo.  The next day she had recurrent chest pain and NST changes on her EKG.  Cath suggested occlusion of a conus branch, no other CAD.  Drug screen was negative for cocaine, positive for THC.  She was placed on Diltiazem, Imdur, Lipitor, ASA, and Plavix.   She is seen in the office today in follow up.  She denies any further chest pain.  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 I reviewed her medications with her in detail, she only had a question about the Lipitor dose time, I told her it would be OK to take at the same time as her other medications.  Past Medical History:  Diagnosis Date  . Chest pain 07/17/2019  . Eczema, allergic   . Psoriasis   . Tobacco use     Past Surgical History:  Procedure Laterality Date  . LEFT HEART CATH AND CORONARY ANGIOGRAPHY N/A 07/18/2019   Procedure: LEFT HEART CATH AND CORONARY ANGIOGRAPHY;  Surgeon: Lyn Records, MD;  Location: [facility] INVASIVE CV LAB;  Service: Cardiovascular;  Laterality: N/A;  . NO PAST SURGERIES      Current Medications: Current Meds  Medication Sig  . aspirin EC 81 MG tablet Take 1 tablet (81 mg total) by mouth daily.  Marland Kitchen atorvastatin (LIPITOR) 40 MG tablet Take 1 tablet (40 mg total) by mouth daily at 6 PM.  . clopidogrel (PLAVIX) 75 MG tablet Take 1 tablet (75 mg total) by mouth daily.  Marland Kitchen diltiazem (CARDIZEM CD) 120 MG 24 hr capsule Take 1 capsule (120 mg total) by mouth daily.  . isosorbide mononitrate (IMDUR) 30 MG 24 hr tablet Take 1 tablet (30 mg total)  by mouth daily.  . nitroGLYCERIN (NITROSTAT) 0.4 MG SL tablet Place 1 tablet (0.4 mg total) under the tongue every 5 (five) minutes x 3 doses as needed for chest pain.     Allergies:   Patient has no known allergies.   Social History   Socioeconomic History  . Marital status: Single    Spouse name: Not on file  . Number of children: Not on file  . Years of education: Not on file  . Highest education level: Not on file  Occupational History  . Not on file  Tobacco Use  . Smoking status: Current Some Day Smoker    Packs/day: 0.50    Types: Cigarettes  . Smokeless tobacco: Never Used  Substance and Sexual Activity  . Alcohol use: No  . Drug use: No  . Sexual activity: Yes    Birth control/protection: None  Other Topics Concern  . Not on file  Social History Narrative  . Not on file   Social Determinants of Health   Financial Resource Strain:   . Difficulty of Paying Living Expenses: Not on file  Food Insecurity:   . Worried About Programme researcher, broadcasting/film/video in the Last Year: Not on file  . Ran Out of Food in  the Last Year: Not on file  Transportation Needs:   . Lack of Transportation (Medical): Not on file  . Lack of Transportation (Non-Medical): Not on file  Physical Activity:   . Days of Exercise per Week: Not on file  . Minutes of Exercise per Session: Not on file  Stress:   . Feeling of Stress : Not on file  Social Connections:   . Frequency of Communication with Friends and Family: Not on file  . Frequency of Social Gatherings with Friends and Family: Not on file  . Attends Religious Services: Not on file  . Active Member of Clubs or Organizations: Not on file  . Attends Banker Meetings: Not on file  . Marital Status: Not on file     Family History: The patient's family history includes Arrhythmia in her paternal grandfather; Diabetes in an other family member; Healthy in her father and mother; Heart disease in her paternal grandfather; Hypertension in  an other family member.  ROS:   Please see the history of present illness.     All other systems reviewed and are negative.  EKGs/Labs/Other Studies Reviewed:    The following studies were reviewed today: Cath 07/18/2019 Echo 07/17/2019  EKG:  EKG is ordered today.  The ekg ordered today demonstrates NSR- HR 54, inferior lateral TWI (the TWI in V3 is new).   Recent Labs: 07/17/2019: ALT 22; Magnesium 2.5; TSH 1.716 07/18/2019: BUN 17; Creatinine, Ser 0.68; Hemoglobin 12.5; Platelets 190; Potassium 3.4; Sodium 136  Recent Lipid Panel    Component Value Date/Time   CHOL 148 07/18/2019 0253   TRIG 39 07/18/2019 0253   HDL 78 07/18/2019 0253   CHOLHDL 1.9 07/18/2019 0253   VLDL 8 07/18/2019 0253   LDLCALC 62 07/18/2019 0253    Physical Exam:    VS:  BP 102/70   Pulse 78   Temp 97.9 F (36.6 C) (Temporal)   Ht 5\' 6"  (1.676 m)   Wt 113 lb (51.3 kg)   LMP 07/14/2019   SpO2 99%   BMI 18.24 kg/m     Wt Readings from Last 3 Encounters:  07/23/19 113 lb (51.3 kg)  07/19/19 106 lb 7.7 oz (48.3 kg)  10/08/18 115 lb (52.2 kg)     GEN:  Thin AA female, well developed in no acute distress HEENT: Normal NECK: No JVD; No carotid bruits CARDIAC: RRR, no murmurs, rubs, gallops RESPIRATORY:  Clear to auscultation without rales, wheezing or rhonchi  ABDOMEN: Soft, non-tender, non-distended MUSCULOSKELETAL:  No edema; No deformity  SKIN: Warm and dry NEUROLOGIC:  Alert and oriented x 3 PSYCHIATRIC:  Normal affect   ASSESSMENT:    NSTEMI (non-ST elevated myocardial infarction) (HCC) Admitted 07/17/2019 with NSTEMI- Troponin peak 1444  CAD (coronary artery disease) Suspected occlusion of conus branch-vs coronary spasm- otherwise normal coronaries and normal LVF  PLAN:    Same Rx for now.  I counseled against THC use and smoking. F/U 3 months.    Medication Adjustments/Labs and Tests Ordered: Current medicines are reviewed at length with the patient today.  Concerns regarding  medicines are outlined above.  Orders Placed This Encounter  Procedures  . EKG 12-Lead   No orders of the defined types were placed in this encounter.   Patient Instructions  Medication Instructions:  Your physician recommends that you continue on your current medications as directed. Please refer to the Current Medication list given to you today. *If you need a refill on your cardiac medications before  your next appointment, please call your pharmacy*  Lab Work: None  If you have labs (blood work) drawn today and your tests are completely normal, you will receive your results only by: Marland Kitchen MyChart Message (if you have MyChart) OR . A paper copy in the mail If you have any lab test that is abnormal or we need to change your treatment, we will call you to review the results.  Testing/Procedures: None   Follow-Up: At [facility], you and your health needs are our priority.  As part of our continuing mission to provide you with exceptional heart care, we have created designated Provider Care Teams.  These Care Teams include your primary Cardiologist (physician) and Advanced Practice Providers (APPs -  Physician Assistants and Nurse Practitioners) who all work together to provide you with the care you need, when you need it.  Your next appointment:   3 month(s)  The format for your next appointment:   In Person  Provider:   Peter Swaziland, MD  Other Instructions    Heart-Healthy Eating Plan Heart-healthy meal planning includes:  Eating less unhealthy fats.  Eating more healthy fats.  Making other changes in your diet. Talk with your doctor or a diet specialist (dietitian) to create an eating plan that is right for you. What is my plan? Your doctor may recommend an eating plan that includes:  Total fat: ______% or less of total calories a day.  Saturated fat: ______% or less of total calories a day.  Cholesterol: less than _________mg a day. What are tips for  following this plan? Cooking Avoid frying your food. Try to bake, boil, grill, or broil it instead. You can also reduce fat by:  Removing the skin from poultry.  Removing all visible fats from meats.  Steaming vegetables in water or broth. Meal planning   At meals, divide your plate into four equal parts: ? Fill one-half of your plate with vegetables and green salads. ? Fill one-fourth of your plate with whole grains. ? Fill one-fourth of your plate with lean protein foods.  Eat 4-5 servings of vegetables per day. A serving of vegetables is: ? 1 cup of raw or cooked vegetables. ? 2 cups of raw leafy greens.  Eat 4-5 servings of fruit per day. A serving of fruit is: ? 1 medium whole fruit. ?  cup of dried fruit. ?  cup of fresh, frozen, or canned fruit. ?  cup of 100% fruit juice.  Eat more foods that have soluble fiber. These are apples, broccoli, carrots, beans, peas, and barley. Try to get 20-30 g of fiber per day.  Eat 4-5 servings of nuts, legumes, and seeds per week: ? 1 serving of dried beans or legumes equals  cup after being cooked. ? 1 serving of nuts is  cup. ? 1 serving of seeds equals 1 tablespoon. General information  Eat more home-cooked food. Eat less restaurant, buffet, and fast food.  Limit or avoid alcohol.  Limit foods that are high in starch and sugar.  Avoid fried foods.  Lose weight if you are overweight.  Keep track of how much salt (sodium) you eat. This is important if you have high blood pressure. Ask your doctor to tell you more about this.  Try to add vegetarian meals each week. Fats  Choose healthy fats. These include olive oil and canola oil, flaxseeds, walnuts, almonds, and seeds.  Eat more omega-3 fats. These include salmon, mackerel, sardines, tuna, flaxseed oil, and ground flaxseeds. Try  to eat fish at least 2 times each week.  Check food labels. Avoid foods with trans fats or high amounts of saturated fat.  Limit  saturated fats. ? These are often found in animal products, such as meats, butter, and cream. ? These are also found in plant foods, such as palm oil, palm kernel oil, and coconut oil.  Avoid foods with partially hydrogenated oils in them. These have trans fats. Examples are stick margarine, some tub margarines, cookies, crackers, and other baked goods. What foods can I eat? Fruits All fresh, canned (in natural juice), or frozen fruits. Vegetables Fresh or frozen vegetables (raw, steamed, roasted, or grilled). Green salads. Grains Most grains. Choose whole wheat and whole grains most of the time. Rice and pasta, including brown rice and pastas made with whole wheat. Meats and other proteins Lean, well-trimmed beef, veal, pork, and lamb. Chicken and [place] without skin. All fish and shellfish. Wild duck, rabbit, pheasant, and venison. Egg whites or low-cholesterol egg substitutes. Dried beans, peas, lentils, and tofu. Seeds and most nuts. Dairy Low-fat or nonfat cheeses, including ricotta and mozzarella. Skim or 1% milk that is liquid, powdered, or evaporated. Buttermilk that is made with low-fat milk. Nonfat or low-fat yogurt. Fats and oils Non-hydrogenated (trans-free) margarines. Vegetable oils, including soybean, sesame, sunflower, olive, peanut, safflower, corn, canola, and cottonseed. Salad dressings or mayonnaise made with a vegetable oil. Beverages Mineral water. Coffee and tea. Diet carbonated beverages. Sweets and desserts Sherbet, gelatin, and fruit ice. Small amounts of dark chocolate. Limit all sweets and desserts. Seasonings and condiments All seasonings and condiments. The items listed above may not be a complete list of foods and drinks you can eat. Contact a dietitian for more options. What foods should I avoid? Fruits Canned fruit in heavy syrup. Fruit in cream or butter sauce. Fried fruit. Limit coconut. Vegetables Vegetables cooked in cheese, cream, or butter sauce.  Fried vegetables. Grains Breads that are made with saturated or trans fats, oils, or whole milk. Croissants. Sweet rolls. Donuts. High-fat crackers, such as cheese crackers. Meats and other proteins Fatty meats, such as hot dogs, ribs, sausage, bacon, rib-eye roast or steak. High-fat deli meats, such as salami and bologna. Caviar. Domestic duck and goose. Organ meats, such as liver. Dairy Cream, sour cream, cream cheese, and creamed cottage cheese. Whole-milk cheeses. Whole or 2% milk that is liquid, evaporated, or condensed. Whole buttermilk. Cream sauce or high-fat cheese sauce. Yogurt that is made from whole milk. Fats and oils Meat fat, or shortening. Cocoa butter, hydrogenated oils, palm oil, coconut oil, palm kernel oil. Solid fats and shortenings, including bacon fat, salt pork, lard, and butter. Nondairy cream substitutes. Salad dressings with cheese or sour cream. Beverages Regular sodas and juice drinks with added sugar. Sweets and desserts Frosting. Pudding. Cookies. Cakes. Pies. Milk chocolate or white chocolate. Buttered syrups. Full-fat ice cream or ice cream drinks. The items listed above may not be a complete list of foods and drinks to avoid. Contact a dietitian for more information. Summary  Heart-healthy meal planning includes eating less unhealthy fats, eating more healthy fats, and making other changes in your diet.  Eat a balanced diet. This includes fruits and vegetables, low-fat or nonfat dairy, lean protein, nuts and legumes, whole grains, and heart-healthy oils and fats. This information is not intended to replace advice given to you by your health care provider. Make sure you discuss any questions you have with your health care provider. Document Revised: 08/10/2017 Document Reviewed: 07/14/2017  Elsevier Patient Education  [address]2020 Elsevier Inc.      Signed, Corine ShelterLuke Annina Piotrowski, [place] JerseyPA-C  07/23/2019 4:24 PM    [facility]

## 2019-07-24 ENCOUNTER — Ambulatory Visit: Payer: Self-pay | Admitting: Cardiology

## 2019-07-29 ENCOUNTER — Telehealth: Payer: Self-pay

## 2019-07-29 NOTE — Telephone Encounter (Signed)
Received a call from patient wanting to know if ok to take Melatonin at night to help her sleep.Dr.Jordan advised ok to take.

## 2019-07-30 NOTE — Progress Notes (Signed)
Patient ID: Janice Esparza, female   DOB: [birth year], 29 y.o.   MRN: [id number]  Virtual Visit via Telephone Note  I connected with Janice Esparza on 07/31/19 at  9:50 AM EST by telephone and verified that I am speaking with the correct person using two identifiers.   I discussed the limitations, risks, security and privacy concerns of performing an evaluation and management service by telephone and the availability of in person appointments. I also discussed with the patient that there may be a patient responsible charge related to this service. The patient expressed understanding and agreed to proceed.  PATIENT visit by telephone virtually in the context of Covid-19 pandemic. Patient location:  home My Location:  [facility] office Persons on the call:  Me and the patient   History of Present Illness: After hospitalization 1/27-1/29/2021 for NSTEMI.  Saw cardiology in follow-up 07/23/2019.  No further CP.  BPs running 120s/80s.  She is checking them a few times a day.  No HA.  No dizziness.  No FH of early heart problems.  She would like to schedule for pap and STD testing.  No s/sx.  She has quit smoking since her hospitlaization.  She was able to get all her meds and is taking all of them.  from discharge summary: Principal Problem:   Non-ST elevation (NSTEMI) myocardial infarction [facility]) Active Problems:   TOBACCO USER   Chest pain   Coronary vasospasm (HCC)   Ms.Farrowhas a hx as stated above who presented to [facility] on 07/17/19 with acute onset of chest pain. She has no PMH of cardiac issues, HTN or diabetes. Her pain was noted to increase with lying down and would improve with sitting up. She had mild nausea but no vomiting. Her breathing was not described as her baseline normal but she denied SOB.   In the ED, WBC and D dimer normal. HS troponin 176>>337. EKG showed NSR with T wave inversion in lead V3, otherwise unchanged from 2016. Sed rate and CRP were also found to be normal. Troponin  rose to 563. Initially treated for possible pericarditis however other differentials included coronary vasospasm and SCAD with plan for cath if recurrent chest pain. Echocardiogram 1/27 was found ot be normal.   Unfortunately, she had acute chest pain once again on 07/18/19 therefore LHC was pursued which showed a clinical dx of possible MINOCA versus CA spasm. After close review of cath imaging per Dr. Katrinka Blazing, could possibly have total occlusion of the conus branch with a stump arising from the pRCA. Recommendations were to purse cMRI which was found to be normal without scar or inflammation. No CAD.   From cardiology f/up 07/23/2019: Janice Esparza is a 29 y.o. female with no hx of CAD or HTN, presented to [facility] [place] 07/17/2019 with SSCP "pressure".  Troponin was elevated- peak 1444.  She had a normal echo.  The next day she had recurrent chest pain and NST changes on her EKG.  Cath suggested occlusion of a conus branch, no other CAD.  Drug screen was negative for cocaine, positive for THC.  She was placed on Diltiazem, Imdur, Lipitor, ASA, and Plavix.   She is seen in the office today in follow up.  She denies any further chest pain.  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 I reviewed her medications with her in detail, she only had a question about the Lipitor dose time, I told her it would be OK to take at the  same time as her other medications.   She has had no recurrent chest pain. Dr. Martinique feels chest pain was secondary to coronary vasospasm therefore her beta blocker was stopped for diltiazem CD 120mg  and IMDUR 15mg  PO QD. She will need to continue ASA and Plavix therapy for now given her cardiac findings.   New medications: ASA, Plavix, diltiazem, IMDUR  Consultants: None   The patient was seen and examined by Dr. Martinique who feels that she is stable and ready for discharge today, 07/19/19. Cath site stable.      Observations/Objective:  NAD.  A&Ox3   Assessment  and Plan: 1. NSTEMI (non-ST elevated myocardial infarction) ([place]) Continue current regimen.  Home BP are sounding overall good avg 120s/80s.  Keep f/up with cardiology.    2. TOBACCO USER She has stopped smoking!  Congratulated on that!!  3. Hospital discharge follow-up Much improved  Follow Up Instructions: Assign PCP and do pap/std testing in 6 weeks   I discussed the assessment and treatment plan with the patient. The patient was provided an opportunity to ask questions and all were answered. The patient agreed with the plan and demonstrated an understanding of the instructions.   The patient was advised to call back or seek an in-person evaluation if the symptoms worsen or if the condition fails to improve as anticipated.  I provided 13 minutes of non-face-to-face time during this encounter.   Freeman Caldron, PA-C

## 2019-07-31 ENCOUNTER — Other Ambulatory Visit: Payer: Self-pay

## 2019-07-31 ENCOUNTER — Ambulatory Visit: Payer: Self-pay | Attending: Family Medicine | Admitting: Physician Assistant

## 2019-07-31 DIAGNOSIS — F17200 Nicotine dependence, unspecified, uncomplicated: Secondary | ICD-10-CM

## 2019-07-31 DIAGNOSIS — F17210 Nicotine dependence, cigarettes, uncomplicated: Secondary | ICD-10-CM

## 2019-07-31 DIAGNOSIS — Z09 Encounter for follow-up examination after completed treatment for conditions other than malignant neoplasm: Secondary | ICD-10-CM

## 2019-07-31 DIAGNOSIS — I214 Non-ST elevation (NSTEMI) myocardial infarction: Secondary | ICD-10-CM

## 2019-07-31 NOTE — Progress Notes (Signed)
Hospital f /u Stated " feeling much better"  Has a BP machine at home / Keeps recordings every day/ Stated quit smoking since she left the hospital BP reading in the morning  124/82 P 91

## 2019-08-13 ENCOUNTER — Inpatient Hospital Stay (HOSPITAL_COMMUNITY)
Admission: EM | Admit: 2019-08-13 | Discharge: 2019-08-18 | DRG: 331 | Disposition: A | Discharge status: Home / Self Care | Payer: Self-pay | Attending: Surgery | Admitting: Surgery

## 2019-08-13 ENCOUNTER — Other Ambulatory Visit: Payer: Self-pay

## 2019-08-13 ENCOUNTER — Emergency Department (HOSPITAL_COMMUNITY): Payer: Self-pay

## 2019-08-13 ENCOUNTER — Encounter (HOSPITAL_COMMUNITY): Payer: Self-pay | Admitting: Family Medicine

## 2019-08-13 ENCOUNTER — Other Ambulatory Visit (HOSPITAL_COMMUNITY): Payer: Self-pay

## 2019-08-13 DIAGNOSIS — K352 Acute appendicitis with generalized peritonitis, without abscess: Secondary | ICD-10-CM

## 2019-08-13 DIAGNOSIS — I10 Essential (primary) hypertension: Secondary | ICD-10-CM | POA: Diagnosis present

## 2019-08-13 DIAGNOSIS — Z8249 Family history of ischemic heart disease and other diseases of the circulatory system: Secondary | ICD-10-CM

## 2019-08-13 DIAGNOSIS — Z5331 Laparoscopic surgical procedure converted to open procedure: Secondary | ICD-10-CM

## 2019-08-13 DIAGNOSIS — I2510 Atherosclerotic heart disease of native coronary artery without angina pectoris: Secondary | ICD-10-CM | POA: Diagnosis present

## 2019-08-13 DIAGNOSIS — Z7902 Long term (current) use of antithrombotics/antiplatelets: Secondary | ICD-10-CM

## 2019-08-13 DIAGNOSIS — K37 Unspecified appendicitis: Secondary | ICD-10-CM | POA: Diagnosis present

## 2019-08-13 DIAGNOSIS — Z20822 Contact with and (suspected) exposure to covid-19: Secondary | ICD-10-CM | POA: Diagnosis present

## 2019-08-13 DIAGNOSIS — D649 Anemia, unspecified: Secondary | ICD-10-CM | POA: Diagnosis present

## 2019-08-13 DIAGNOSIS — Z79899 Other long term (current) drug therapy: Secondary | ICD-10-CM

## 2019-08-13 DIAGNOSIS — K3580 Unspecified acute appendicitis: Principal | ICD-10-CM | POA: Diagnosis present

## 2019-08-13 DIAGNOSIS — I252 Old myocardial infarction: Secondary | ICD-10-CM

## 2019-08-13 DIAGNOSIS — Z87891 Personal history of nicotine dependence: Secondary | ICD-10-CM

## 2019-08-13 DIAGNOSIS — K353 Acute appendicitis with localized peritonitis, without perforation or gangrene: Secondary | ICD-10-CM

## 2019-08-13 DIAGNOSIS — R1013 Epigastric pain: Secondary | ICD-10-CM

## 2019-08-13 DIAGNOSIS — Z7982 Long term (current) use of aspirin: Secondary | ICD-10-CM

## 2019-08-13 DIAGNOSIS — E876 Hypokalemia: Secondary | ICD-10-CM | POA: Diagnosis present

## 2019-08-13 HISTORY — DX: Non-ST elevation (NSTEMI) myocardial infarction: I21.4

## 2019-08-13 HISTORY — DX: Essential (primary) hypertension: I10

## 2019-08-13 LAB — COMPREHENSIVE METABOLIC PANEL
ALT: 19 U/L (ref 0–44)
AST: 26 U/L (ref 15–41)
Albumin: 4.8 g/dL (ref 3.5–5.0)
Alkaline Phosphatase: 75 U/L (ref 38–126)
Anion gap: 18 — ABNORMAL HIGH (ref 5–15)
BUN: 16 mg/dL (ref 6–20)
CO2: 23 mmol/L (ref 22–32)
Calcium: 6.7 mg/dL — ABNORMAL LOW (ref 8.9–10.3)
Chloride: 100 mmol/L (ref 98–111)
Creatinine, Ser: 0.73 mg/dL (ref 0.44–1.00)
GFR calc Af Amer: >60 mL/min (ref >60)
GFR calc non Af Amer: >60 mL/min (ref >60)
Glucose, Bld: 106 mg/dL — ABNORMAL HIGH (ref 70–99)
Potassium: 5 mmol/L (ref 3.5–5.1)
Sodium: 141 mmol/L (ref 135–145)
Total Bilirubin: 0.5 mg/dL (ref 0.3–1.2)
Total Protein: 9.1 g/dL — ABNORMAL HIGH (ref 6.5–8.1)

## 2019-08-13 LAB — RESPIRATORY PANEL BY RT PCR (FLU A&B, COVID)
Influenza A by PCR: NEGATIVE
Influenza B by PCR: NEGATIVE
SARS Coronavirus 2 by RT PCR: NEGATIVE

## 2019-08-13 LAB — CBC WITH DIFFERENTIAL/PLATELET
Abs Immature Granulocytes: 0.06 10*3/uL (ref 0.00–0.07)
Basophils Absolute: 0 10*3/uL (ref 0.0–0.1)
Basophils Relative: 0 %
Eosinophils Absolute: 0 10*3/uL (ref 0.0–0.5)
Eosinophils Relative: 0 %
HCT: 39.1 % (ref 36.0–46.0)
Hemoglobin: 13.2 g/dL (ref 12.0–15.0)
Immature Granulocytes: 0 %
Lymphocytes Relative: 9 %
Lymphs Abs: 1.4 10*3/uL (ref 0.7–4.0)
MCH: 31.3 pg (ref 26.0–34.0)
MCHC: 33.8 g/dL (ref 30.0–36.0)
MCV: 92.7 fL (ref 80.0–100.0)
Monocytes Absolute: 0.6 10*3/uL (ref 0.1–1.0)
Monocytes Relative: 4 %
Neutro Abs: 13.2 10*3/uL — ABNORMAL HIGH (ref 1.7–7.7)
Neutrophils Relative %: 87 %
Platelets: 185 10*3/uL (ref 150–400)
RBC: 4.22 MIL/uL (ref 3.87–5.11)
RDW: 12.1 % (ref 11.5–15.5)
WBC: 15.3 10*3/uL — ABNORMAL HIGH (ref 4.0–10.5)
nRBC: 0 % (ref 0.0–0.2)

## 2019-08-13 LAB — URINALYSIS, ROUTINE W REFLEX MICROSCOPIC
Bilirubin Urine: NEGATIVE
Glucose, UA: NEGATIVE mg/dL
Hgb urine dipstick: NEGATIVE
Ketones, ur: 20 mg/dL — AB
Leukocytes,Ua: NEGATIVE
Nitrite: POSITIVE — AB
Protein, ur: NEGATIVE mg/dL
Specific Gravity, Urine: 1.016 (ref 1.005–1.030)
pH: 7 (ref 5.0–8.0)

## 2019-08-13 LAB — LIPASE, BLOOD: Lipase: 17 U/L (ref 11–51)

## 2019-08-13 LAB — I-STAT BETA HCG BLOOD, ED (MC, WL, AP ONLY): I-stat hCG, quantitative: <5 m[IU]/mL (ref <5)

## 2019-08-13 MED ORDER — ONDANSETRON HCL 4 MG/2ML IJ SOLN
4.0000 mg | Freq: Four times a day (QID) | INTRAMUSCULAR | Status: DC | PRN
  Administered 2019-08-16 – 2019-08-18 (×2): 4 mg via INTRAVENOUS
  Filled 2019-08-13 (×2): qty 2

## 2019-08-13 MED ORDER — FAMOTIDINE IN NACL 20-0.9 MG/50ML-% IV SOLN
20.0000 mg | Freq: Once | INTRAVENOUS | Status: AC
  Administered 2019-08-13: 12:00:00 20 mg via INTRAVENOUS
  Filled 2019-08-13: qty 50

## 2019-08-13 MED ORDER — METRONIDAZOLE IN NACL 5-0.79 MG/ML-% IV SOLN
500.0000 mg | Freq: Once | INTRAVENOUS | Status: AC
  Administered 2019-08-13: 17:00:00 500 mg via INTRAVENOUS
  Filled 2019-08-13: qty 100

## 2019-08-13 MED ORDER — SODIUM CHLORIDE (PF) 0.9 % IJ SOLN
INTRAMUSCULAR | Status: AC
  Filled 2019-08-13: qty 50

## 2019-08-13 MED ORDER — KCL IN DEXTROSE-NACL 20-5-0.9 MEQ/L-%-% IV SOLN
INTRAVENOUS | Status: DC
  Filled 2019-08-13 (×9): qty 1000

## 2019-08-13 MED ORDER — ISOSORBIDE MONONITRATE ER 30 MG PO TB24
30.0000 mg | ORAL_TABLET | Freq: Every day | ORAL | Status: DC
  Administered 2019-08-13 – 2019-08-18 (×6): 30 mg via ORAL
  Filled 2019-08-13 (×6): qty 1

## 2019-08-13 MED ORDER — ONDANSETRON 4 MG PO TBDP: 4.0000 mg | ORAL_TABLET | Freq: Four times a day (QID) | ORAL | Status: DC | PRN

## 2019-08-13 MED ORDER — SODIUM CHLORIDE 0.9% FLUSH: 3.0000 mL | Freq: Once | INTRAVENOUS | Status: DC

## 2019-08-13 MED ORDER — MORPHINE SULFATE (PF) 2 MG/ML IV SOLN
1.0000 mg | INTRAVENOUS | Status: DC | PRN
  Administered 2019-08-13: 1 mg via INTRAVENOUS
  Administered 2019-08-14 – 2019-08-16 (×3): 2 mg via INTRAVENOUS
  Administered 2019-08-16: 03:00:00 1 mg via INTRAVENOUS
  Administered 2019-08-16: 2 mg via INTRAVENOUS
  Filled 2019-08-13 (×6): qty 1

## 2019-08-13 MED ORDER — NITROGLYCERIN 0.4 MG SL SUBL: 0.4000 mg | SUBLINGUAL_TABLET | SUBLINGUAL | Status: DC | PRN

## 2019-08-13 MED ORDER — IOHEXOL 300 MG/ML  SOLN
75.0000 mL | Freq: Once | INTRAMUSCULAR | Status: AC | PRN
  Administered 2019-08-13: 14:00:00 75 mL via INTRAVENOUS

## 2019-08-13 MED ORDER — PANTOPRAZOLE SODIUM 40 MG IV SOLR
40.0000 mg | Freq: Once | INTRAVENOUS | Status: AC
  Administered 2019-08-13: 11:00:00 40 mg via INTRAVENOUS
  Filled 2019-08-13: qty 40

## 2019-08-13 MED ORDER — IOHEXOL 9 MG/ML PO SOLN
500.0000 mL | ORAL | Status: DC
  Administered 2019-08-13: 12:00:00 1000 mL via ORAL

## 2019-08-13 MED ORDER — ISOSORBIDE MONONITRATE ER 30 MG PO TB24
30.0000 mg | ORAL_TABLET | Freq: Once | ORAL | Status: DC
  Filled 2019-08-13: qty 1

## 2019-08-13 MED ORDER — ONDANSETRON HCL 4 MG/2ML IJ SOLN
4.0000 mg | Freq: Once | INTRAMUSCULAR | Status: AC
  Administered 2019-08-13: 11:00:00 4 mg via INTRAVENOUS
  Filled 2019-08-13: qty 2

## 2019-08-13 MED ORDER — SODIUM CHLORIDE 0.9 % IV SOLN
2.0000 g | INTRAVENOUS | Status: DC
  Administered 2019-08-14 – 2019-08-17 (×3): 2 g via INTRAVENOUS
  Filled 2019-08-13 (×3): qty 20
  Filled 2019-08-13: qty 2

## 2019-08-13 MED ORDER — ATORVASTATIN CALCIUM 40 MG PO TABS
40.0000 mg | ORAL_TABLET | Freq: Every day | ORAL | Status: DC
  Administered 2019-08-13 – 2019-08-17 (×5): 40 mg via ORAL
  Filled 2019-08-13 (×5): qty 1

## 2019-08-13 MED ORDER — DILTIAZEM HCL ER COATED BEADS 120 MG PO CP24
120.0000 mg | ORAL_CAPSULE | Freq: Every day | ORAL | Status: DC
  Administered 2019-08-13 – 2019-08-18 (×6): 120 mg via ORAL
  Filled 2019-08-13 (×6): qty 1

## 2019-08-13 MED ORDER — METRONIDAZOLE IN NACL 5-0.79 MG/ML-% IV SOLN
500.0000 mg | Freq: Three times a day (TID) | INTRAVENOUS | Status: DC
  Administered 2019-08-14 – 2019-08-18 (×14): 500 mg via INTRAVENOUS
  Filled 2019-08-13 (×14): qty 100

## 2019-08-13 MED ORDER — PANTOPRAZOLE SODIUM 40 MG IV SOLR
40.0000 mg | Freq: Every day | INTRAVENOUS | Status: DC
  Administered 2019-08-13 – 2019-08-17 (×5): 40 mg via INTRAVENOUS
  Filled 2019-08-13 (×5): qty 40

## 2019-08-13 MED ORDER — SODIUM CHLORIDE 0.9 % IV BOLUS
1000.0000 mL | Freq: Once | INTRAVENOUS | Status: AC
  Administered 2019-08-13: 11:00:00 1000 mL via INTRAVENOUS

## 2019-08-13 MED ORDER — IOHEXOL 9 MG/ML PO SOLN
ORAL | Status: AC
  Filled 2019-08-13: qty 1000

## 2019-08-13 MED ORDER — DILTIAZEM HCL ER COATED BEADS 120 MG PO CP24
120.0000 mg | ORAL_CAPSULE | Freq: Once | ORAL | Status: DC
  Filled 2019-08-13: qty 1

## 2019-08-13 MED ORDER — SODIUM CHLORIDE 0.9 % IV SOLN
2.0000 g | Freq: Once | INTRAVENOUS | Status: AC
  Administered 2019-08-13: 17:00:00 2 g via INTRAVENOUS
  Filled 2019-08-13: qty 20

## 2019-08-13 MED ORDER — MORPHINE SULFATE (PF) 4 MG/ML IV SOLN
4.0000 mg | Freq: Once | INTRAVENOUS | Status: AC
  Administered 2019-08-13: 4 mg via INTRAVENOUS
  Filled 2019-08-13: qty 1

## 2019-08-13 MED ORDER — DICYCLOMINE HCL 10 MG PO CAPS
20.0000 mg | ORAL_CAPSULE | Freq: Once | ORAL | Status: AC
  Administered 2019-08-13: 20 mg via ORAL
  Filled 2019-08-13: qty 2

## 2019-08-13 NOTE — ED Triage Notes (Signed)
Patient is complaining of generalized abd pain that started yesterday all of a sudden. Reports nausea and vomiting despite drinking an alcoholic beverage last night. Denies any fever, diarrhea, urinary symptoms, or vaginal bleeding.

## 2019-08-13 NOTE — ED Notes (Signed)
Patient given bedside commode to provide urine sample. Unable to urinate at this time.

## 2019-08-13 NOTE — Progress Notes (Signed)
   OK to hold both Plavix and aspirin prior to appendectomy.  May need to wait 3 to 5 days to reduce bleeding.  Interestingly, cardiac MRI showed no evidence of scarring infarction, no pericardial inflammation during her prior admission.  Cardiac cath was essentially normal.  Suspicion was potential coronary vasospasm during last admission.  From a cardiac perspective, she should be okay to proceed with appendectomy.  Full consult to follow.  Donato Schultz, MD

## 2019-08-13 NOTE — H&P (Signed)
Janice Esparza is an 29 y.o. female.   Chief Complaint: Abdominal pain HPI: The patient is a 29 year old white female who presents with right-sided abdominal pain that started yesterday.  The pain is been associated with a couple episodes of nausea and vomiting.  She denies any fevers or chills.  Since the pain persisted she came to the emergency department where a CT scan was performed.  She was found to have appendicitis with no evidence of perforation or abscess.  She was hospitalized in January with a NSTEMI.  She states that she did not have a stent placed but was started on Plavix and aspirin.  Past Medical History:  Diagnosis Date  . Chest pain 07/17/2019  . Eczema, allergic   . Hypertension   . NSTEMI (non-ST elevated myocardial infarction) ([place])   . Psoriasis   . Tobacco use     Past Surgical History:  Procedure Laterality Date  . LEFT HEART CATH AND CORONARY ANGIOGRAPHY N/A 07/18/2019   Procedure: LEFT HEART CATH AND CORONARY ANGIOGRAPHY;  Surgeon: Belva Crome, MD;  Location: [facility];  Service: Cardiovascular;  Laterality: N/A;  . NO PAST SURGERIES      Family History  Problem Relation Age of Onset  . Healthy Mother   . Healthy Father   . Diabetes Other   . Hypertension Other   . Heart disease Paternal Grandfather   . Arrhythmia Paternal Grandfather    Social History:  reports that she quit smoking about 2 weeks ago. Her smoking use included cigarettes. She smoked 0.50 packs per day. She has never used smokeless tobacco. She reports current alcohol use. She reports current drug use. Drug: Marijuana.  Allergies: No Known Allergies  (Not in a hospital admission)   Results for orders placed or performed during the hospital encounter of 08/13/19 (from the past 48 hour(s))  Lipase, blood     Status: None   Collection Time: 08/13/19 10:36 AM  Result Value Ref Range   Lipase 17 11 - 51 U/L    Comment: Performed at [facility], [place]  [address], [place], [place] [address]  Comprehensive metabolic panel     Status: Abnormal   Collection Time: 08/13/19 10:36 AM  Result Value Ref Range   Sodium 141 135 - 145 mmol/L   Potassium 5.0 3.5 - 5.1 mmol/L   Chloride 100 98 - 111 mmol/L   CO2 23 22 - 32 mmol/L   Glucose, Bld 106 (H) 70 - 99 mg/dL    Comment: Glucose reference range applies only to samples taken after fasting for at least 8 hours.   BUN 16 6 - 20 mg/dL   Creatinine, Ser 0.73 0.44 - 1.00 mg/dL   Calcium 6.7 (L) 8.9 - 10.3 mg/dL   Total Protein 9.1 (H) 6.5 - 8.1 g/dL   Albumin 4.8 3.5 - 5.0 g/dL   AST 26 15 - 41 U/L   ALT 19 0 - 44 U/L   Alkaline Phosphatase 75 38 - 126 U/L   Total Bilirubin 0.5 0.3 - 1.2 mg/dL   GFR calc non Af Amer >60 >60 mL/min   GFR calc Af Amer >60 >60 mL/min   Anion gap 18 (H) 5 - 15    Comment: Performed at [facility], [place] [address] [place], [place] [address]  CBC with Differential     Status: Abnormal   Collection Time: 08/13/19 10:36 AM  Result Value Ref Range   WBC 15.3 (H)  4.0 - 10.5 K/uL   RBC 4.22 3.87 - 5.11 MIL/uL   Hemoglobin 13.2 12.0 - 15.0 g/dL   HCT 34.1 96.2 - 22.9 %   MCV 92.7 80.0 - 100.0 fL   MCH 31.3 26.0 - 34.0 pg   MCHC 33.8 30.0 - 36.0 g/dL   RDW 79.8 92.1 - 19.4 %   Platelets 185 150 - 400 K/uL   nRBC 0.0 0.0 - 0.2 %   Neutrophils Relative % 87 %   Neutro Abs 13.2 (H) 1.7 - 7.7 K/uL   Lymphocytes Relative 9 %   Lymphs Abs 1.4 0.7 - 4.0 K/uL   Monocytes Relative 4 %   Monocytes Absolute 0.6 0.1 - 1.0 K/uL   Eosinophils Relative 0 %   Eosinophils Absolute 0.0 0.0 - 0.5 K/uL   Basophils Relative 0 %   Basophils Absolute 0.0 0.0 - 0.1 K/uL   Immature Granulocytes 0 %   Abs Immature Granulocytes 0.06 0.00 - 0.07 K/uL    Comment: Performed at [facility], [address] [place], [place] [address]  I-Stat beta hCG blood, ED     Status: None   Collection Time: 08/13/19 10:47 AM  Result Value Ref Range   I-stat  hCG, quantitative <5.0 <5 mIU/mL   Comment 3            Comment:   GEST. AGE      CONC.  (mIU/mL)   <=1 WEEK        5 - 50     2 WEEKS       50 - 500     3 WEEKS       100 - 10,000     4 WEEKS     1,000 - 30,000        FEMALE AND NON-PREGNANT FEMALE:     LESS THAN 5 mIU/mL   Urinalysis, Routine w reflex microscopic     Status: Abnormal   Collection Time: 08/13/19 12:51 PM  Result Value Ref Range   Color, Urine YELLOW YELLOW   APPearance CLEAR CLEAR   Specific Gravity, Urine 1.016 1.005 - 1.030   pH 7.0 5.0 - 8.0   Glucose, UA NEGATIVE NEGATIVE mg/dL   Hgb urine dipstick NEGATIVE NEGATIVE   Bilirubin Urine NEGATIVE NEGATIVE   Ketones, ur 20 (A) NEGATIVE mg/dL   Protein, ur NEGATIVE NEGATIVE mg/dL   Nitrite POSITIVE (A) NEGATIVE   Leukocytes,Ua NEGATIVE NEGATIVE   RBC / HPF 0-5 0 - 5 RBC/hpf   WBC, UA 0-5 0 - 5 WBC/hpf   Bacteria, UA MANY (A) NONE SEEN   Squamous Epithelial / LPF 0-5 0 - 5   Mucus PRESENT    Hyaline Casts, UA PRESENT     Comment: Performed at [facility], [address] [place], [place] [address]   CT ABDOMEN PELVIS W CONTRAST  Result Date: 08/13/2019 CLINICAL DATA:  Generalized abdominal pain. Sudden onset 24 hours prior. Nausea and vomiting. EXAM: CT ABDOMEN AND PELVIS WITH CONTRAST TECHNIQUE: Multidetector CT imaging of the abdomen and pelvis was performed using the standard protocol following bolus administration of intravenous contrast. CONTRAST:  28mL OMNIPAQUE IOHEXOL 300 MG/ML  SOLN COMPARISON:  Ultrasound 08/13/2019 FINDINGS: Lower chest: Lung bases are clear. Hepatobiliary: No focal hepatic lesion. No biliary duct dilatation. Gallbladder is normal. Common bile duct is normal. Pancreas: Pancreas is normal. No ductal dilatation. No pancreatic inflammation. Spleen: Normal spleen Adrenals/urinary tract: Adrenal glands normal. Focus of renal cortical scarring  in the mid RIGHT kidney best seen on coronal image 74/5. There multiple bilateral  simple fluid renal cysts. There is mild hydronephrosis in the upper pole of the LEFT and RIGHT kidney. No duplicated collecting system identified. No evidence acute pyelonephritis. Ureters and bladder normal. Stomach/Bowel: Stomach, duodenum small-bowel normal. Contrast flows through the terminal ileum into the cecum. There is mucosal thickening at the base the cecum (image 60/2). The mucosal thickening measures up to 10 mm (image 45/5). The appendix is stool-filled and distended to 14 mm (image 57/2). Towards the tip of the appendix there is mucosal hyperemia with the appendix measuring 10 mm (image 54/2). There is gas the tip of the appendix). Appendix: Location: Inferior to the cecum.  Typical location Diameter: 14 mm Appendicolith: Absent Mucosal hyper-enhancement: Present Extraluminal gas: Absent Periappendiceal collection: Absent No evidence of perforation or abscess. The ascending, transverse and descending colon normal. Vascular/Lymphatic: Abdominal aorta is normal caliber. There is no retroperitoneal or periportal lymphadenopathy. No pelvic lymphadenopathy. Reproductive: Uterus and ovaries normal Other: No free fluid. Musculoskeletal: No aggressive osseous lesion. IMPRESSION: 1. Acute appendicitis with distended mildly inflamed appendix. No perforation or abscess. 2. Heaped up mucosal thickening at the base of the cecum around the orifice of the appendix. Differential includes mucosal edema related to acute appendicitis or mucosal lesion occluding the orifice of the appendix with secondary appendicitis. 3. Renal cortical scarring in the mid RIGHT kidney. Mild hydronephrosis the pole of the kidneys. Findings could relate to vesicoureteral reflux. No duplicated system identifie on this study. 4. Multiple bilateral renal cysts. Findings conveyed toSHAWN JOY on 08/13/2019  at15:03. Electronically Signed   By: Genevive Bi M.D.   On: 08/13/2019 15:04   US Abdomen Limited RUQ  Result Date:  08/13/2019 CLINICAL DATA:  Mid epigastric abdominal pain for 24 hours EXAM: ULTRASOUND ABDOMEN LIMITED RIGHT UPPER QUADRANT COMPARISON:  None. FINDINGS: Gallbladder: No gallstones or wall thickening visualized. No sonographic Murphy sign noted by sonographer. Common bile duct: Diameter: 2 mm Liver: No focal lesion identified. Within normal limits in parenchymal echogenicity. Portal vein is patent on color Doppler imaging with normal direction of blood flow towards the liver. IMPRESSION: Negative right upper quadrant ultrasound. Electronically Signed   By: Marnee Spring M.D.   On: 08/13/2019 11:25    Review of Systems  Constitutional: Negative.   HENT: Negative.   Eyes: Negative.   Respiratory: Negative.   Cardiovascular: Positive for chest pain.  Gastrointestinal: Positive for abdominal pain, nausea and vomiting.  Endocrine: Negative.   Genitourinary: Negative.   Musculoskeletal: Negative.   Skin: Negative.   Allergic/Immunologic: Negative.   Neurological: Negative.   Hematological: Negative.   Psychiatric/Behavioral: Negative.     Blood pressure (!) 147/88, pulse 64, temperature 98.9 F (37.2 C), temperature source Oral, resp. rate 16, height 5\' 6"  (1.676 m), weight 49 kg, last menstrual period 08/04/2019, SpO2 100 %. Physical Exam  Constitutional: She is oriented to person, place, and time. She appears well-developed and well-nourished. No distress.  Young bf in nad  HENT:  Head: Normocephalic and atraumatic.  Right Ear: External ear normal.  Left Ear: External ear normal.  Nose: Nose normal.  Mouth/Throat: Oropharynx is clear and moist.  Eyes: Pupils are equal, round, and reactive to light. Conjunctivae and EOM are normal. No scleral icterus.  Neck: No tracheal deviation present. No thyromegaly present.  Cardiovascular: Normal rate, regular rhythm, normal heart sounds and intact distal pulses.  No pitting edema lower extr  Respiratory: Effort normal and breath  sounds normal.  No respiratory distress.  No accessory respiratory muscle use  GI: Soft. Bowel sounds are normal.  There is mild right sided tenderness. No guarding or peritonitis  Musculoskeletal:        General: No tenderness or edema. Normal range of motion.     Cervical back: Normal range of motion and neck supple.     Comments: Normal gait  Lymphadenopathy:    She has no cervical adenopathy.  No groin or cervical lymphadenopathy  Neurological: She is alert and oriented to person, place, and time.  Skin: Skin is warm and dry. No rash noted.  Psychiatric: She has a normal mood and affect. Her behavior is normal. Thought content normal.     Assessment/Plan The patient appears to have acute appendicitis.  Her situation is complicated by the recent myocardial infarction and the fact that she is now on blood thinners.  This puts her at very high risk of having a complication from the surgery.  We will plan to admit her to the hospital and start her on broad-spectrum antibiotic therapy.  We will consult cardiology to see if it safe to take her to the operating room and can we take her off the blood thinners.  We will try to follow the recommendations as long as she is stable.  If she improves on antibiotics then we have time to work all this out.  If she does not or worsens then she may have to have surgery sooner rather than later.  Chevis Pretty III, MD 08/13/2019, 4:14 PM

## 2019-08-13 NOTE — ED Provider Notes (Signed)
[facility]-EMERGENCY DEPT Provider Note   CSN: [id number] Arrival date & time: 08/13/19  4403     History Chief Complaint  Patient presents with  . Abdominal Pain    ASHELEY HELLBERG is a 29 y.o. female.  HPI      JALENA VANDERLINDEN is a 29 y.o. female, with a history of NSTEMI and HTN, presenting to the ED with upper abdominal pain beginning yesterday.  Pain is intermittent, cramping and sharp, radiating toward the left abdomen, 10/10.  She states she has not had this pain before.  Nonbloody, nonbilious vomiting yesterday, but none today.  Continues to have nausea. She has a very recent history of drinking at least 16-20 ounces of liquor daily.  Since her most recent hospitalization, patient states she only drinks this amount about every 3 days rather than daily.   Patient was admitted to the hospital with NSTEMI July 17, 2019.  Left heart cath showed possible total occlusion of the conus branch with clinical diagnosis of MINOCA vs CA spasm vs mild pericarditis. Subsequent cardiac MRI was read as normal. Last bowel movement was this morning and was normal. Last food was last night before 10pm.  Denies fever/chills, chest pain, shortness of breath, cough, flank/back pain, urinary symptoms, diarrhea, hematochezia/melena, or any other complaints.  Past Medical History:  Diagnosis Date  . Chest pain 07/17/2019  . Eczema, allergic   . Hypertension   . NSTEMI (non-ST elevated myocardial infarction) (HCC)   . Psoriasis   . Tobacco use     Patient Active Problem List   Diagnosis Date Noted  . CAD (coronary artery disease) 07/23/2019  . Coronary vasospasm (HCC)   . NSTEMI (non-ST elevated myocardial infarction) (HCC) 07/18/2019  . Chest pain 07/17/2019  . Elevated troponin   . 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 TOBACCO USER 03/07/2009  . BACTERIAL VAGINITIS 02/03/2009  . NAUSEA AND VOMITING 02/03/2009  . RHINITIS, ALLERGIC 08/17/2006  . ECZEMA, ATOPIC  DERMATITIS 08/17/2006    Past Surgical History:  Procedure Laterality Date  . LEFT HEART CATH AND CORONARY ANGIOGRAPHY N/A 07/18/2019   Procedure: LEFT HEART CATH AND CORONARY ANGIOGRAPHY;  Surgeon: Lyn Records, MD;  [facility] INVASIVE CV LAB;  Service: Cardiovascular;  Laterality: N/A;  . NO PAST SURGERIES       OB History   No obstetric history on file.     Family History  Problem Relation Age of Onset  . Healthy Mother   . Healthy Father   . Diabetes Other   . Hypertension Other   . Heart disease Paternal Grandfather   . Arrhythmia Paternal Grandfather     Social History   Tobacco Use  . Smoking status: Former Smoker    Packs/day: 0.50    Types: Cigarettes    Quit date: 07/29/2019    Years since quitting: 0.0  . Smokeless tobacco: Never Used  Substance Use Topics  . Alcohol use: Yes    Comment: 3 times a week; last drink last night   . Drug use: Yes    Types: Marijuana    Comment: Daily     Home Medications Prior to Admission medications   Medication Sig Start Date End Date Taking? Authorizing Provider  aspirin EC 81 MG tablet Take 1 tablet (81 mg total) by mouth daily. 07/20/19 07/19/20 Yes Georgie Chard D, NP  atorvastatin (LIPITOR) 40 MG tablet Take 1 tablet (40 mg total) by mouth daily at 6 PM. Patient taking differently: Take 40  mg by mouth daily at 12 noon.  07/19/19  Yes Georgie Chard D, NP  clopidogrel (PLAVIX) 75 MG tablet Take 1 tablet (75 mg total) by mouth daily. 07/20/19  Yes Georgie Chard D, NP  diltiazem (CARDIZEM CD) 120 MG 24 hr capsule Take 1 capsule (120 mg total) by mouth daily. 07/20/19  Yes Georgie Chard D, NP  isosorbide mononitrate (IMDUR) 30 MG 24 hr tablet Take 1 tablet (30 mg total) by mouth daily. 07/20/19  Yes Georgie Chard D, NP  nitroGLYCERIN (NITROSTAT) 0.4 MG SL tablet Place 1 tablet (0.4 mg total) under the tongue every 5 (five) minutes x 3 doses as needed for chest pain. Patient not taking: Reported on 07/31/2019 07/19/19    Filbert Schilder, NP    Allergies    Patient has no known allergies.  Review of Systems   Review of Systems  Constitutional: Negative for chills, diaphoresis and fever.  Respiratory: Negative for cough and shortness of breath.   Cardiovascular: Negative for chest pain and leg swelling.  Gastrointestinal: Positive for abdominal pain, nausea and vomiting. Negative for blood in stool, constipation and diarrhea.  Genitourinary: Negative for dysuria, flank pain, frequency, hematuria, vaginal bleeding and vaginal discharge.  Musculoskeletal: Negative for back pain.  Neurological: Negative for syncope and weakness.  All other systems reviewed and are negative.   Physical Exam Updated Vital Signs BP (!) 135/122 (BP Location: Right Arm)   Pulse 100   Temp 98.9 F (37.2 C) (Oral)   Resp 18   LMP 07/14/2019   SpO2 99%   Physical Exam Vitals and nursing note reviewed.  Constitutional:      General: She is not in acute distress.    Appearance: She is well-developed. She is not diaphoretic.  HENT:     Head: Normocephalic and atraumatic.     Mouth/Throat:     Mouth: Mucous membranes are moist.     Pharynx: Oropharynx is clear.  Eyes:     Conjunctiva/sclera: Conjunctivae normal.  Cardiovascular:     Rate and Rhythm: Normal rate and regular rhythm.     Pulses: Normal pulses.          Radial pulses are 2+ on the right side and 2+ on the left side.       Posterior tibial pulses are 2+ on the right side and 2+ on the left side.     Heart sounds: Normal heart sounds.     Comments: Tactile temperature in the extremities appropriate and equal bilaterally. Pulmonary:     Effort: Pulmonary effort is normal. No respiratory distress.     Breath sounds: Normal breath sounds.  Abdominal:     Palpations: Abdomen is soft.     Tenderness: There is abdominal tenderness. There is no guarding.    Musculoskeletal:     Cervical back: Neck supple.     Right lower leg: No edema.     Left lower  leg: No edema.  Lymphadenopathy:     Cervical: No cervical adenopathy.  Skin:    General: Skin is warm and dry.  Neurological:     Mental Status: She is alert.  Psychiatric:        Mood and Affect: Mood and affect normal.        Speech: Speech normal.        Behavior: Behavior normal.     ED Results / Procedures / Treatments   Labs (all labs ordered are listed, but only abnormal results are displayed) Labs Reviewed  COMPREHENSIVE METABOLIC PANEL - Abnormal; Notable for the following components:      Result Value   Glucose, Bld 106 (*)    Calcium 6.7 (*)    Total Protein 9.1 (*)    Anion gap 18 (*)    All other components within normal limits  URINALYSIS, ROUTINE W REFLEX MICROSCOPIC - Abnormal; Notable for the following components:   Ketones, ur 20 (*)    Nitrite POSITIVE (*)    Bacteria, UA MANY (*)    All other components within normal limits  CBC WITH DIFFERENTIAL/PLATELET - Abnormal; Notable for the following components:   WBC 15.3 (*)    Neutro Abs 13.2 (*)    All other components within normal limits  URINE CULTURE  URINE CULTURE  RESPIRATORY PANEL BY RT PCR (FLU A&B, COVID)  LIPASE, BLOOD  I-STAT BETA HCG BLOOD, ED ([facility], [facility], [facility] ONLY)    EKG EKG Interpretation  Date/Time:  Tuesday August 13 2019 11:03:38 EST Ventricular Rate:  69 PR Interval:    QRS Duration: 80 QT Interval:  415 QTC Calculation: 445 R Axis:   69 Text Interpretation: Sinus rhythm LVH by voltage no sig change Confirmed by Charlesetta Shanks [phone]) on 08/13/2019 3:46:38 PM   Radiology CT ABDOMEN PELVIS W CONTRAST  Result Date: 08/13/2019 CLINICAL DATA:  Generalized abdominal pain. Sudden onset 24 hours prior. Nausea and vomiting. EXAM: CT ABDOMEN AND PELVIS WITH CONTRAST TECHNIQUE: Multidetector CT imaging of the abdomen and pelvis was performed using the standard protocol following bolus administration of intravenous contrast. CONTRAST:  52mL OMNIPAQUE IOHEXOL 300 MG/ML  SOLN COMPARISON:   Ultrasound 08/13/2019 FINDINGS: Lower chest: Lung bases are clear. Hepatobiliary: No focal hepatic lesion. No biliary duct dilatation. Gallbladder is normal. Common bile duct is normal. Pancreas: Pancreas is normal. No ductal dilatation. No pancreatic inflammation. Spleen: Normal spleen Adrenals/urinary tract: Adrenal glands normal. Focus of renal cortical scarring in the mid RIGHT kidney best seen on coronal image 74/5. There multiple bilateral simple fluid renal cysts. There is mild hydronephrosis in the upper pole of the LEFT and RIGHT kidney. No duplicated collecting system identified. No evidence acute pyelonephritis. Ureters and bladder normal. Stomach/Bowel: Stomach, duodenum small-bowel normal. Contrast flows through the terminal ileum into the cecum. There is mucosal thickening at the base the cecum (image 60/2). The mucosal thickening measures up to 10 mm (image 45/5). The appendix is stool-filled and distended to 14 mm (image 57/2). Towards the tip of the appendix there is mucosal hyperemia with the appendix measuring 10 mm (image 54/2). There is gas the tip of the appendix). Appendix: Location: Inferior to the cecum.  Typical location Diameter: 14 mm Appendicolith: Absent Mucosal hyper-enhancement: Present Extraluminal gas: Absent Periappendiceal collection: Absent No evidence of perforation or abscess. The ascending, transverse and descending colon normal. Vascular/Lymphatic: Abdominal aorta is normal caliber. There is no retroperitoneal or periportal lymphadenopathy. No pelvic lymphadenopathy. Reproductive: Uterus and ovaries normal Other: No free fluid. Musculoskeletal: No aggressive osseous lesion. IMPRESSION: 1. Acute appendicitis with distended mildly inflamed appendix. No perforation or abscess. 2. Heaped up mucosal thickening at the base of the cecum around the orifice of the appendix. Differential includes mucosal edema related to acute appendicitis or mucosal lesion occluding the orifice of the  appendix with secondary appendicitis. 3. Renal cortical scarring in the mid RIGHT kidney. Mild hydronephrosis the pole of the kidneys. Findings could relate to vesicoureteral reflux. No duplicated system identifie on this study. 4. Multiple bilateral renal cysts. Findings conveyed toSHAWN Katelynn Heidler on 08/13/2019  at15:03. Electronically Signed   By: Genevive Bi M.D.   On: 08/13/2019 15:04   US Abdomen Limited RUQ  Result Date: 08/13/2019 CLINICAL DATA:  Mid epigastric abdominal pain for 24 hours EXAM: ULTRASOUND ABDOMEN LIMITED RIGHT UPPER QUADRANT COMPARISON:  None. FINDINGS: Gallbladder: No gallstones or wall thickening visualized. No sonographic Murphy sign noted by sonographer. Common bile duct: Diameter: 2 mm Liver: No focal lesion identified. Within normal limits in parenchymal echogenicity. Portal vein is patent on color Doppler imaging with normal direction of blood flow towards the liver. IMPRESSION: Negative right upper quadrant ultrasound. Electronically Signed   By: Marnee Spring M.D.   On: 08/13/2019 11:25    Procedures Procedures (including critical care time)  Medications Ordered in ED Medications  sodium chloride flush (NS) 0.9 % injection 3 mL (3 mLs Intravenous Not Given 08/13/19 1037)  iohexol (OMNIPAQUE) 9 MG/ML oral solution (  Canceled Entry 08/13/19 1534)  iohexol (OMNIPAQUE) 9 MG/ML oral solution 500 mL ( Oral Canceled Entry 08/13/19 1534)  sodium chloride (PF) 0.9 % injection (  Canceled Entry 08/13/19 1509)  cefTRIAXone (ROCEPHIN) 2 g in sodium chloride 0.9 % 100 mL IVPB (has no administration in time range)    And  metroNIDAZOLE (FLAGYL) IVPB 500 mg (has no administration in time range)  morphine 4 MG/ML injection 4 mg (4 mg Intravenous Given 08/13/19 1037)  ondansetron (ZOFRAN) injection 4 mg (4 mg Intravenous Given 08/13/19 1037)  sodium chloride 0.9 % bolus 1,000 mL (0 mLs Intravenous Stopped 08/13/19 1112)  pantoprazole (PROTONIX) injection 40 mg (40 mg Intravenous Given  08/13/19 1128)  famotidine (PEPCID) IVPB 20 mg premix (0 mg Intravenous Stopped 08/13/19 1204)  dicyclomine (BENTYL) capsule 20 mg (20 mg Oral Given 08/13/19 1224)  iohexol (OMNIPAQUE) 300 MG/ML solution 75 mL (75 mLs Intravenous Contrast Given 08/13/19 1406)    ED Course  I have reviewed the triage vital signs and the nursing notes.  Pertinent labs & imaging results that were available during my care of the patient were reviewed by me and considered in my medical decision making (see chart for details).  Clinical Course as of Aug 12 1606  Tue Aug 13, 2019  1056 Patient has not taken her blood pressure medications today.  BP(!): 135/122 [SJ]  1152 Patient states her pain has improved to 5/10.  It is more localized to the epigastric region.   [SJ]  1531 Spoke with Nehemiah Settle, [place] with general surgery. Agrees to come see the patient.   [SJ]  1606 Nehemiah Settle states they will admit the patient.   [SJ]    Clinical Course User Index [SJ] Lesean Woolverton, Hillard Danker, PA-C   MDM Rules/Calculators/A&P                       Patient presents with abdominal pain. Patient is nontoxic appearing, afebrile, not tachycardic, not tachypneic, not hypotensive, maintains excellent SPO2 on room air.  Mild leukocytosis.  I have reviewed the patient's chart to obtain more information.  I noted the following: I was able to review provider and procedure notes from her hospital stay starting January 27.  I reviewed and interpreted the patient's labs and radiological studies. UA with many bacteria and positive nitrites, however, patient without urinary symptoms. CT with evidence of acute appendicitis.  General surgery notified and will admit.  Findings and plan of care discussed with Arby Barrette, MD.   There were additional, incidental abnormalities noted on the patient's CT.  These were mentioned to  the patient.  However, I also sent an inbox message to patient's recently established PCP, Georgian Co, PA-C.  Vitals:    08/13/19 1224 08/13/19 1226 08/13/19 1230 08/13/19 1330  BP: (!) 142/83  (!) 155/101 (!) 147/88  Pulse: (!) 53 (!) 52 (!) 52 64  Resp: 19  16 16   Temp:      TempSrc:      SpO2: 95%  98% 100%  Weight:      Height:         Final Clinical Impression(s) / ED Diagnoses Final diagnoses:  Epigastric pain  Acute appendicitis with localized peritonitis, without perforation, abscess, or gangrene    Rx / DC Orders ED Discharge Orders    None       Concepcion Living 08/13/19 1633    Arby Barrette, MD 08/14/19 [phone]

## 2019-08-14 DIAGNOSIS — Z01810 Encounter for preprocedural cardiovascular examination: Secondary | ICD-10-CM

## 2019-08-14 DIAGNOSIS — K36 Other appendicitis: Secondary | ICD-10-CM

## 2019-08-14 LAB — BASIC METABOLIC PANEL
Anion gap: 9 (ref 5–15)
BUN: 10 mg/dL (ref 6–20)
CO2: 24 mmol/L (ref 22–32)
Calcium: 8.8 mg/dL — ABNORMAL LOW (ref 8.9–10.3)
Chloride: 105 mmol/L (ref 98–111)
Creatinine, Ser: 0.55 mg/dL (ref 0.44–1.00)
GFR calc Af Amer: >60 mL/min (ref >60)
GFR calc non Af Amer: >60 mL/min (ref >60)
Glucose, Bld: 96 mg/dL (ref 70–99)
Potassium: 3.4 mmol/L — ABNORMAL LOW (ref 3.5–5.1)
Sodium: 138 mmol/L (ref 135–145)

## 2019-08-14 LAB — CBC
HCT: 32.1 % — ABNORMAL LOW (ref 36.0–46.0)
Hemoglobin: 10.5 g/dL — ABNORMAL LOW (ref 12.0–15.0)
MCH: 31.4 pg (ref 26.0–34.0)
MCHC: 32.7 g/dL (ref 30.0–36.0)
MCV: 96.1 fL (ref 80.0–100.0)
Platelets: 147 10*3/uL — ABNORMAL LOW (ref 150–400)
RBC: 3.34 MIL/uL — ABNORMAL LOW (ref 3.87–5.11)
RDW: 12.2 % (ref 11.5–15.5)
WBC: 8.1 10*3/uL (ref 4.0–10.5)
nRBC: 0 % (ref 0.0–0.2)

## 2019-08-14 LAB — MRSA PCR SCREENING: MRSA by PCR: NEGATIVE

## 2019-08-14 MED ORDER — POTASSIUM CHLORIDE CRYS ER 20 MEQ PO TBCR
40.0000 meq | EXTENDED_RELEASE_TABLET | Freq: Two times a day (BID) | ORAL | Status: AC
  Administered 2019-08-14 (×2): 40 meq via ORAL
  Filled 2019-08-14 (×2): qty 2

## 2019-08-14 MED ORDER — ENOXAPARIN SODIUM 30 MG/0.3ML ~~LOC~~ SOLN
30.0000 mg | SUBCUTANEOUS | Status: DC
  Administered 2019-08-14 – 2019-08-15 (×2): 30 mg via SUBCUTANEOUS
  Filled 2019-08-14 (×2): qty 0.3

## 2019-08-14 MED ORDER — ACETAMINOPHEN 325 MG PO TABS: 650.0000 mg | ORAL_TABLET | Freq: Four times a day (QID) | ORAL | Status: DC | PRN

## 2019-08-14 NOTE — Plan of Care (Signed)
?  Problem: Education: ?Goal: Knowledge of General Education information will improve ?Description: Including pain rating scale, medication(s)/side effects and non-pharmacologic comfort measures ?Outcome: Progressing ?  ?Problem: Nutrition: ?Goal: Adequate nutrition will be maintained ?Outcome: Progressing ?  ?Problem: Coping: ?Goal: Level of anxiety will decrease ?Outcome: Progressing ?  ?Problem: Elimination: ?Goal: Will not experience complications related to bowel motility ?Outcome: Progressing ?Goal: Will not experience complications related to urinary retention ?Outcome: Progressing ?  ?

## 2019-08-14 NOTE — Consult Note (Addendum)
Cardiology Consultation:   Patient ID: Janice Esparza MRN: [id number]; DOB: 08-Oct-1990  Admit date: 08/13/2019 Date of Consult: 08/14/2019  Primary Care Provider: Patient, No Pcp Per Primary Cardiologist: Peter Swaziland, MD  Primary Electrophysiologist:  None    Patient Profile:   Janice Esparza is a 29 y.o. female who was recently admitted in 06/2019 with substernal chest pain and elevated troponin felt to be MINOCA vs coronary artery spasm vs mild pericarditis (cardiac catheterization showed possible occlusion of conus branch but no other CAD). MRI showed no evidence of scar or pericarditis. Otherwise, has a history of hypertension and tobacco use. She presented to the [facility] ED on 08/13/2019 with abdominal pain and was found to have acute appendicitis. Cardiology was consulted for pre-operative evaluation at the request of Dr. Carolynne Edouard (General Surgery).  History of Present Illness:   Janice Esparza is a 29 year old female with the above history who is a patient of Dr. Elvis Coil. She was admitted in 06/2019 for chest pain and elevated high-sensitivity troponin that peaked at 1,444. Echo showed LVEF of 60-65% with normal wall motion. Patient underwent cardiac catheterization that showed possible occlusion of the conus branch and proximal RCA ectasia but no other CAD. Symptoms and elevated troponin were felt to be due to [facility] vs coronary artery spasm vs mild pericarditis. Therefore, Cardiac MRI was performed and showed no evidence of scar or pericarditis so coronary spasm was felt to be the most likely etiology. She was discharged on dual antiplatelet therapy with Aspirin and Plavix as well as Cardizem and Imdur. She was seen for follow-up in our office on 07/23/2019 and denied any recurrent chest pain at that time.   Patient presented to the [facility] ED on 08/13/2019 with abdominal pain, nausea, and vomiting that started the day before. In the ED, patient hypertensive but vitals signs stable. EKG  showed non-specific ST/T changes (improving from prior tracings). WBC 15.3, Hgb 13.2, Plts 185. Na 141, K 5.0, Glucose 106, BUN 16, Cr 0.73. LFTs normal. Lipase normal. Urinalysis positive for bacteria, nitrites, and ketones. Urine culture pending. Right upper quadrant ultrasound unremarkable. Abdominal/pelvic CT showed acute appendicitis as well as multiple bilateral renal cysts with renal cortical scarring in the mid right kidney and mild hydronephrosis of the poles of the kidney (which could relate to vesicoureteral reflux). Patient was started on IV antibiotics and admitted for further management. Cardiology consulted for pre-operative evaluation.   At the time of this evaluation, patient resting comfortably in bed. She continues to have waxing and waning abdominal pain but states it has improved some since being started on antibiotics. She states she has done well from a cardiac standpoint since being discharged last month. No recurrent chest pain. No shortness of breath, palpitations, lightheadedness, dizziness, syncope, orthopnea, PND, or edema. No abnormal bleeding on dual antiplatelet therapy. No fevers. She states she has stopped smoking since last admission.   Heart Pathway Score:     Past Medical History:  Diagnosis Date  . Chest pain 07/17/2019  . Eczema, allergic   . Hypertension   . NSTEMI (non-ST elevated myocardial infarction) (HCC)   . Psoriasis   . Tobacco use     Past Surgical History:  Procedure Laterality Date  . LEFT HEART CATH AND CORONARY ANGIOGRAPHY N/A 07/18/2019   Procedure: LEFT HEART CATH AND CORONARY ANGIOGRAPHY;  Surgeon: Lyn Records, MD;  Location: [facility] INVASIVE CV LAB;  Service: Cardiovascular;  Laterality: N/A;  . NO PAST SURGERIES  Home Medications:  Prior to Admission medications   Medication Sig Start Date End Date Taking? Authorizing Provider  aspirin EC 81 MG tablet Take 1 tablet (81 mg total) by mouth daily. 07/20/19 07/19/20 Yes Georgie ChardMcDaniel, Jill D,  NP  atorvastatin (LIPITOR) 40 MG tablet Take 1 tablet (40 mg total) by mouth daily at 6 PM. Patient taking differently: Take 40 mg by mouth daily at 12 noon.  07/19/19  Yes Georgie ChardMcDaniel, Jill D, NP  clopidogrel (PLAVIX) 75 MG tablet Take 1 tablet (75 mg total) by mouth daily. 07/20/19  Yes Georgie ChardMcDaniel, Jill D, NP  diltiazem (CARDIZEM CD) 120 MG 24 hr capsule Take 1 capsule (120 mg total) by mouth daily. 07/20/19  Yes Georgie ChardMcDaniel, Jill D, NP  isosorbide mononitrate (IMDUR) 30 MG 24 hr tablet Take 1 tablet (30 mg total) by mouth daily. 07/20/19  Yes Georgie ChardMcDaniel, Jill D, NP  nitroGLYCERIN (NITROSTAT) 0.4 MG SL tablet Place 1 tablet (0.4 mg total) under the tongue every 5 (five) minutes x 3 doses as needed for chest pain. Patient not taking: Reported on 07/31/2019 07/19/19   Georgie ChardMcDaniel, Jill D, NP    Inpatient Medications: Scheduled Meds: . atorvastatin  40 mg Oral q1800  . diltiazem  120 mg Oral Daily  . isosorbide mononitrate  30 mg Oral Daily  . pantoprazole (PROTONIX) IV  40 mg Intravenous QHS  . sodium chloride flush  3 mL Intravenous Once   Continuous Infusions: . cefTRIAXone (ROCEPHIN)  IV     And  . metronidazole Stopped (08/14/19 0318)  . dextrose 5 % and 0.9 % NaCl with KCl 20 mEq/L 100 mL/hr at 08/13/19 1753   PRN Meds: morphine injection, nitroGLYCERIN, ondansetron **OR** ondansetron (ZOFRAN) IV  Allergies:   No Known Allergies  Social History:   Social History   Socioeconomic History  . Marital status: Single    Spouse name: Not on file  . Number of children: Not on file  . Years of education: Not on file  . Highest education level: Not on file  Occupational History  . Not on file  Tobacco Use  . Smoking status: Former Smoker    Packs/day: 0.50    Types: Cigarettes    Quit date: 07/29/2019    Years since quitting: 0.0  . Smokeless tobacco: Never Used  Substance and Sexual Activity  . Alcohol use: Yes    Comment: 3 times a week; last drink last night   . Drug use: Yes    Types:  Marijuana    Comment: Daily   . Sexual activity: Yes    Birth control/protection: None  Other Topics Concern  . Not on file  Social History Narrative  . Not on file   Social Determinants of Health   Financial Resource Strain:   . Difficulty of Paying Living Expenses: Not on file  Food Insecurity:   . Worried About Programme researcher, broadcasting/film/videounning Out of Food in the Last Year: Not on file  . Ran Out of Food in the Last Year: Not on file  Transportation Needs:   . Lack of Transportation (Medical): Not on file  . Lack of Transportation (Non-Medical): Not on file  Physical Activity:   . Days of Exercise per Week: Not on file  . Minutes of Exercise per Session: Not on file  Stress:   . Feeling of Stress : Not on file  Social Connections:   . Frequency of Communication with Friends and Family: Not on file  . Frequency of Social Gatherings with Friends and  Family: Not on file  . Attends Religious Services: Not on file  . Active Member of Clubs or Organizations: Not on file  . Attends Banker Meetings: Not on file  . Marital Status: Not on file  Intimate Partner Violence:   . Fear of Current or Ex-Partner: Not on file  . Emotionally Abused: Not on file  . Physically Abused: Not on file  . Sexually Abused: Not on file    Family History:   Family History  Problem Relation Age of Onset  . Healthy Mother   . Healthy Father   . Diabetes Other   . Hypertension Other   . Heart disease Paternal Grandfather   . Arrhythmia Paternal Grandfather      ROS:  Please see the history of present illness.  All other ROS reviewed and negative.     Physical Exam/Data:   Vitals:   08/13/19 1619 08/13/19 1732 08/13/19 2114 08/14/19 0542  BP: (!) 148/101 133/89 113/72 121/83  Pulse: 81 70 62 (!) 57  Resp: 20 18 16 16   Temp:  98.7 F (37.1 C) 98.3 F (36.8 C) 98.1 F (36.7 C)  TempSrc:  Oral Oral Oral  SpO2: 100% 100% 99% 100%  Weight:      Height:        Intake/Output Summary (Last 24 hours)  at 08/14/2019 0713 Last data filed at 08/14/2019 0600 Gross per 24 hour  Intake 1734.73 ml  Output 300 ml  Net 1434.73 ml   Last 3 Weights 08/13/2019 07/23/2019 07/19/2019  Weight (lbs) 108 lb 113 lb 106 lb 7.7 oz  Weight (kg) 48.988 kg 51.256 kg 48.3 kg     Body mass index is 17.43 kg/m.  General: 29 y.o. female resting comfortably in no acute distress. HEENT: Normocephalic and atraumatic. Sclera clear.  Neck: Supple. No carotid bruits. No JVD. Heart: RRR. Distinct S1 and S2. No murmurs, gallops, or rubs. Radial and posterior tibial pulses 2+ and equal bilaterally. Lungs: No increased work of breathing. Clear to ausculation bilaterally. No wheezes, rhonchi, or rales.  Abdomen: Soft, non-distended, and tender to palpation. Bowel sounds present. MSK: Normal strength and tone for age. Extremities: No lower extremity edema.    Skin: Warm and dry. Neuro: Alert and oriented x3. No focal deficits. Psych: Normal affect. Responds appropriately.   EKG:  The EKG was personally reviewed and demonstrates:  Normal sinus rhythm, rate 69 bpm, with LVH and non-specific ST/T changes that seem to be improving from prior tracings.  Telemetry:  Patient not on telemetry.  Relevant CV Studies: Echocardiogram 07/17/2019:  Impressions:  1. Left ventricular ejection fraction, by visual estimation, is 60 to  65%. The left ventricle has normal function. There is no left ventricular  hypertrophy.  2. The left ventricle has no regional wall motion abnormalities.  3. Global right ventricle has normal systolic function.The right  ventricular size is normal. No increase in right ventricular wall  thickness.  4. Left atrial size was normal.  5. Right atrial size was normal.  6. The mitral valve is normal in structure. Trivial mitral valve  regurgitation. No evidence of mitral stenosis.  7. The tricuspid valve is normal in structure.  8. The tricuspid valve is normal in structure. Tricuspid valve    regurgitation is not demonstrated.  9. The aortic valve is normal in structure. Aortic valve regurgitation is  not visualized. No evidence of aortic valve sclerosis or stenosis.  10. The pulmonic valve was grossly normal. Pulmonic valve regurgitation  is  not visualized.  11. The inferior vena cava is normal in size with greater than 50%  respiratory variability, suggesting right atrial pressure of 3 mmHg.  _______________  Cardiac Catheterization 07/18/2019:   Normal left main   Normal LAD diagonals.   Normal circumflex   Right coronary, with relatively small distal territory. Proximal RCA ectasia. 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   Normal LV function.    Recommendations:   Clinical diagnosis is MINOCA vs CA spasm vs mild pericarditis.    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   Consider MRI, cardiac.  _______________  Cardiac MRI 07/18/2019:  Impressions:  1. Normal left ventricular size, thickness and systolic function  (LVEF = 58%). There are no regional wall motion abnormalities and no  late gadolinium enhancement in the left ventricular myocardium.  2. Normal right ventricular size, thickness and systolic function  (LVEF = 49%). There are no regional wall motion abnormalities.  3. Normal left and right atrial size.  4. Normal size of the aortic root, ascending aorta and pulmonary  artery.  5. No significant valvular abnormalities. Trivial mitral  regurgitation.  6. Normal pericardium. Trivial pericardial effusion.    Normal cardiac MRI, there is no evidence for myocardial edema or  scarring, no evidence for pericarditis, troponin elevation possibly  represents a coronary spasm.    Laboratory Data:  High Sensitivity Troponin:   Recent Labs  Lab 07/17/19 0834 07/17/19 1036 07/17/19 1226 07/17/19 1757 07/17/19 2340  TROPONINIHS 176* 337* 563*  1,444* 1,434*     Chemistry Recent Labs  Lab 08/13/19 1036 08/14/19 0424  NA 141 138  K 5.0 3.4*  CL 100 105  CO2 23 24  GLUCOSE 106* 96  BUN 16 10  CREATININE 0.73 0.55  CALCIUM 6.7* 8.8*  GFRNONAA >60 >60  GFRAA >60 >60  ANIONGAP 18* 9    Recent Labs  Lab 08/13/19 1036  PROT 9.1*  ALBUMIN 4.8  AST 26  ALT 19  ALKPHOS 75  BILITOT 0.5   Hematology Recent Labs  Lab 08/13/19 1036 08/14/19 0424  WBC 15.3* 8.1  RBC 4.22 3.34*  HGB 13.2 10.5*  HCT 39.1 32.1*  MCV 92.7 96.1  MCH 31.3 31.4  MCHC 33.8 32.7  RDW 12.1 12.2  PLT 185 147*   BNPNo results for input(s): BNP, PROBNP in the last 168 hours.  DDimer No results for input(s): DDIMER in the last 168 hours.   Radiology/Studies:  CT ABDOMEN PELVIS W CONTRAST  Result Date: 08/13/2019 CLINICAL DATA:  Generalized abdominal pain. Sudden onset 24 hours prior. Nausea and vomiting. EXAM: CT ABDOMEN AND PELVIS WITH CONTRAST TECHNIQUE: Multidetector CT imaging of the abdomen and pelvis was performed using the standard protocol following bolus administration of intravenous contrast. CONTRAST:  32mL OMNIPAQUE IOHEXOL 300 MG/ML  SOLN COMPARISON:  Ultrasound 08/13/2019 FINDINGS: Lower chest: Lung bases are clear. Hepatobiliary: No focal hepatic lesion. No biliary duct dilatation. Gallbladder is normal. Common bile duct is normal. Pancreas: Pancreas is normal. No ductal dilatation. No pancreatic inflammation. Spleen: Normal spleen Adrenals/urinary tract: Adrenal glands normal. Focus of renal cortical scarring in the mid RIGHT kidney best seen on coronal image 74/5. There multiple bilateral simple fluid renal cysts. There is mild hydronephrosis in the upper pole of the LEFT and RIGHT kidney. No duplicated collecting system identified. No evidence acute pyelonephritis. Ureters and bladder normal. Stomach/Bowel: Stomach, duodenum small-bowel normal.  Contrast flows through the terminal ileum into the cecum. There is mucosal thickening at  the base the cecum (image 60/2). The mucosal thickening measures up to 10 mm (image 45/5). The appendix is stool-filled and distended to 14 mm (image 57/2). Towards the tip of the appendix there is mucosal hyperemia with the appendix measuring 10 mm (image 54/2). There is gas the tip of the appendix). Appendix: Location: Inferior to the cecum.  Typical location Diameter: 14 mm Appendicolith: Absent Mucosal hyper-enhancement: Present Extraluminal gas: Absent Periappendiceal collection: Absent No evidence of perforation or abscess. The ascending, transverse and descending colon normal. Vascular/Lymphatic: Abdominal aorta is normal caliber. There is no retroperitoneal or periportal lymphadenopathy. No pelvic lymphadenopathy. Reproductive: Uterus and ovaries normal Other: No free fluid. Musculoskeletal: No aggressive osseous lesion. IMPRESSION: 1. Acute appendicitis with distended mildly inflamed appendix. No perforation or abscess. 2. Heaped up mucosal thickening at the base of the cecum around the orifice of the appendix. Differential includes mucosal edema related to acute appendicitis or mucosal lesion occluding the orifice of the appendix with secondary appendicitis. 3. Renal cortical scarring in the mid RIGHT kidney. Mild hydronephrosis the pole of the kidneys. Findings could relate to vesicoureteral reflux. No duplicated system identifie on this study. 4. Multiple bilateral renal cysts. Findings conveyed toSHAWN JOY on 08/13/2019  at15:03. Electronically Signed   By: Suzy Bouchard M.D.   On: 08/13/2019 15:04   US Abdomen Limited RUQ  Result Date: 08/13/2019 CLINICAL DATA:  Mid epigastric abdominal pain for 24 hours EXAM: ULTRASOUND ABDOMEN LIMITED RIGHT UPPER QUADRANT COMPARISON:  None. FINDINGS: Gallbladder: No gallstones or wall thickening visualized. No sonographic Murphy sign noted by sonographer. Common bile duct: Diameter: 2 mm Liver: No focal lesion identified. Within normal limits in parenchymal  echogenicity. Portal vein is patent on color Doppler imaging with normal direction of blood flow towards the liver. IMPRESSION: Negative right upper quadrant ultrasound. Electronically Signed   By: Monte Fantasia M.D.   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 Cardiac catheterization showed possible total occlusion of the conus branch. Cardiac MRI showed no evidence of scarring or pericarditis. Troponin elevation felt to likely be due to coronary spasm. She was treated with dual antiplatelet therapy (Aspirin and Plavix), Cardizem, and Imdur.  - No recurrent angina. - Aspirin and Plavix on hold in anticipation for appendectomy.  - Continue Cardizem 120mg  daily, Imdur 30mg  daily, and Lipitor 40mg  daily.  Hypertension  - BP elevated on presentation but has since improved. Most recent BP 121/83.  - Continue Cardizem and Imdur as above.  Pre-Op Evaluation  - Patient admitted with acute appendicitis. Cardiology consulted for pre-op evaluation.  - Per Revised Cardiac Risk Index, considered low risk. May be a little higher due to recent NSTEMI although this was felt to be due to coronary spasm rather than CAD. Able to complete >4.0 METS. Therefore, patient would be at acceptable risk to procedure with appendectomy. No further cardiac work-up needed at this time. OK to hold Aspirin and Plavix for surgery. Per Dr. Marlou Porch, may need to wait 3-5 days to reduce bleeding.  - MD to follow.  Acute Appendicitis - Currently being treated with Ceftriaxone and Flagyl. Leukocytosis has improved. Plan is for appendectomy. - Management per General Surgery.  Anemia - Hemoglobin 10.5 today, down from 13.2 yesterday. Patient denies any abnormal bleeding  including hematuria, hematochezia, and melena. - Aspirin and Plavix being held in anticipation for surgery. -  Continue to monitor.   Hypokalemia - Potassium 3.4 today.  - Will defer supplementation to primary team.   For questions or updates, please contact [facility] Please consult [email or web address] for contact info under     Signed, Corrin Parker, PA-C  08/14/2019 7:13 AM   Personally seen and examined. Agree with above.  29 year old female with recent troponin elevation felt to be possible coronary artery spasm versus myocardial infarction with nonobstructive coronary arteries with MRI showing no evidence of scar pericarditis here with acute appendicitis.  Her cardiac event occurred in January 2021.  She was discharged from the hospital on both aspirin and Plavix at that time.  There is no clear or obvious etiology for her troponin elevation at that time.  She comes in yesterday with abdominal discomfort.  Appendicitis diagnosed.  Echo showed EF of 65%.  Cath reviewed personally.  Normal coronary arteries.  MRI reviewed as well.  No infarct.  GEN: Well nourished, well developed, in no acute distress  HEENT: normal  Neck: no JVD, carotid bruits, or masses Cardiac: RRR; no murmurs, rubs, or gallops,no edema  Respiratory:  clear to auscultation bilaterally, normal work of breathing GI: mild tenderness, + BS MS: no deformity or atrophy  Skin: warm and dry, no rash Neuro:  Alert and Oriented x 3, Strength and sensation are intact Psych: euthymic mood, full affect  A/P:  Pre op Appendicitis Recent elevated troponin with no CAD   - may proceed with surgery after Plavix washout. OK to hold ASA as well prior to surgery to reduce bleeding risks.   - No coronary stent placement in 06/2019.   We are available if needed. Would encourage resuming Plavix perhaps day 2 post op to reduce bleeding risk.   Donato Schultz, MD

## 2019-08-14 NOTE — Progress Notes (Signed)
[facility] Progress Note     Subjective: CC-  Abdomen still sore but states that she is starving. Tolerating clear liquids without nausea or vomiting. WBC normalized 8.7, afebrile.  Objective: Vital signs in last 24 hours: Temp:  [98.1 F (36.7 C)-98.9 F (37.2 C)] 98.1 F (36.7 C) (02/24 0542) Pulse Rate:  [52-100] 57 (02/24 0542) Resp:  [16-20] 16 (02/24 0542) BP: (113-155)/(72-122) 121/83 (02/24 0542) SpO2:  [95 %-100 %] 100 % (02/24 0542) Weight:  [49 kg] 49 kg (02/23 1009) Last BM Date: 08/13/19  Intake/Output from previous day: 02/23 0701 - 02/24 0700 In: 1734.7 [P.O.:360; I.V.:1100; IV Piggyback:274.7] Out: 300 [Urine:300] Intake/Output this shift: Total I/O In: -  Out: 250 [Urine:250]  PE: Gen:  Alert, NAD Card:  RRR Pulm:  CTAB, no W/R/R, rate and effort normal Abd: Soft, ND, +BS, no HSM, no hernia, TTP RLQ>LLQ with voluntary guarding/ no peritonitis Psych: A&Ox4  Skin: no rashes noted, warm and dry  Lab Results:  Recent Labs    08/13/19 1036 08/14/19 0424  WBC 15.3* 8.1  HGB 13.2 10.5*  HCT 39.1 32.1*  PLT 185 147*   BMET Recent Labs    08/13/19 1036 08/14/19 0424  NA 141 138  K 5.0 3.4*  CL 100 105  CO2 23 24  GLUCOSE 106* 96  BUN 16 10  CREATININE 0.73 0.55  CALCIUM 6.7* 8.8*   PT/INR No results for input(s): LABPROT, INR in the last 72 hours. CMP     Component Value Date/Time   NA 138 08/14/2019 0424   K 3.4 (L) 08/14/2019 0424   CL 105 08/14/2019 0424   CO2 24 08/14/2019 0424   GLUCOSE 96 08/14/2019 0424   BUN 10 08/14/2019 0424   CREATININE 0.55 08/14/2019 0424   CALCIUM 8.8 (L) 08/14/2019 0424   PROT 9.1 (H) 08/13/2019 1036   ALBUMIN 4.8 08/13/2019 1036   AST 26 08/13/2019 1036   ALT 19 08/13/2019 1036   ALKPHOS 75 08/13/2019 1036   BILITOT 0.5 08/13/2019 1036   GFRNONAA >60 08/14/2019 0424   GFRAA >60 08/14/2019 0424   Lipase     Component Value Date/Time   LIPASE 17 08/13/2019 1036        Studies/Results: CT ABDOMEN PELVIS W CONTRAST  Result Date: 08/13/2019 CLINICAL DATA:  Generalized abdominal pain. Sudden onset 24 hours prior. Nausea and vomiting. EXAM: CT ABDOMEN AND PELVIS WITH CONTRAST TECHNIQUE: Multidetector CT imaging of the abdomen and pelvis was performed using the standard protocol following bolus administration of intravenous contrast. CONTRAST:  3mL OMNIPAQUE IOHEXOL 300 MG/ML  SOLN COMPARISON:  Ultrasound 08/13/2019 FINDINGS: Lower chest: Lung bases are clear. Hepatobiliary: No focal hepatic lesion. No biliary duct dilatation. Gallbladder is normal. Common bile duct is normal. Pancreas: Pancreas is normal. No ductal dilatation. No pancreatic inflammation. Spleen: Normal spleen Adrenals/urinary tract: Adrenal glands normal. Focus of renal cortical scarring in the mid RIGHT kidney best seen on coronal image 74/5. There multiple bilateral simple fluid renal cysts. There is mild hydronephrosis in the upper pole of the LEFT and RIGHT kidney. No duplicated collecting system identified. No evidence acute pyelonephritis. Ureters and bladder normal. Stomach/Bowel: Stomach, duodenum small-bowel normal. Contrast flows through the terminal ileum into the cecum. There is mucosal thickening at the base the cecum (image 60/2). The mucosal thickening measures up to 10 mm (image 45/5). The appendix is stool-filled and distended to 14 mm (image 57/2). Towards the tip of the appendix there is mucosal hyperemia with the appendix  measuring 10 mm (image 54/2). There is gas the tip of the appendix). Appendix: Location: Inferior to the cecum.  Typical location Diameter: 14 mm Appendicolith: Absent Mucosal hyper-enhancement: Present Extraluminal gas: Absent Periappendiceal collection: Absent No evidence of perforation or abscess. The ascending, transverse and descending colon normal. Vascular/Lymphatic: Abdominal aorta is normal caliber. There is no retroperitoneal or periportal  lymphadenopathy. No pelvic lymphadenopathy. Reproductive: Uterus and ovaries normal Other: No free fluid. Musculoskeletal: No aggressive osseous lesion. IMPRESSION: 1. Acute appendicitis with distended mildly inflamed appendix. No perforation or abscess. 2. Heaped up mucosal thickening at the base of the cecum around the orifice of the appendix. Differential includes mucosal edema related to acute appendicitis or mucosal lesion occluding the orifice of the appendix with secondary appendicitis. 3. Renal cortical scarring in the mid RIGHT kidney. Mild hydronephrosis the pole of the kidneys. Findings could relate to vesicoureteral reflux. No duplicated system identifie on this study. 4. Multiple bilateral renal cysts. Findings conveyed toSHAWN JOY on 08/13/2019  at15:03. Electronically Signed   By: Genevive Bi M.D.   On: 08/13/2019 15:04   US Abdomen Limited RUQ  Result Date: 08/13/2019 CLINICAL DATA:  Mid epigastric abdominal pain for 24 hours EXAM: ULTRASOUND ABDOMEN LIMITED RIGHT UPPER QUADRANT COMPARISON:  None. FINDINGS: Gallbladder: No gallstones or wall thickening visualized. No sonographic Murphy sign noted by sonographer. Common bile duct: Diameter: 2 mm Liver: No focal lesion identified. Within normal limits in parenchymal echogenicity. Portal vein is patent on color Doppler imaging with normal direction of blood flow towards the liver. IMPRESSION: Negative right upper quadrant ultrasound. Electronically Signed   By: Marnee Spring M.D.   On: 08/13/2019 11:25    Anti-infectives: Anti-infectives (From admission, onward)   Start     Dose/Rate Route Frequency Ordered Stop   08/14/19 1600  cefTRIAXone (ROCEPHIN) 2 g in sodium chloride 0.9 % 100 mL IVPB     2 g 200 mL/hr over 30 Minutes Intravenous Every 24 hours 08/13/19 1732     08/14/19 0100  metroNIDAZOLE (FLAGYL) IVPB 500 mg     500 mg 100 mL/hr over 60 Minutes Intravenous Every 8 hours 08/13/19 1732     08/13/19 1515  cefTRIAXone  (ROCEPHIN) 2 g in sodium chloride 0.9 % 100 mL IVPB     2 g 200 mL/hr over 30 Minutes Intravenous  Once 08/13/19 1505 08/13/19 1701   08/13/19 1515  metroNIDAZOLE (FLAGYL) IVPB 500 mg     500 mg 100 mL/hr over 60 Minutes Intravenous  Once 08/13/19 1505 08/13/19 1806       Assessment/Plan ?Recent Coronary vasospasm - on plavix and ASA prior to admission (last dose 2/22). Ok to hold plavix/ASA and proceed with surgery 3-5 days  HTN Anemia Hypokalemia - K 3.4, replace  Acute appendicitis - Patient is still tender on exam but has no peritonitis, WBC now WNL, and VSS. Continue IV rocephin/flagyl. Ok for full liquids. Continue to hold plavix/ASA. Likely plan for surgery Friday.  ID - rocephin/flagyl 2/23>> FEN - IVF, FLD VTE - SCDs, lovenox Foley - none Follow up - TBD   LOS: 1 day    Franne Forts, [facility] 08/14/2019, 8:37 AM Please see Amion for pager number during day hours 7:00am-4:30pm

## 2019-08-14 NOTE — Plan of Care (Signed)
  Problem: Education: Goal: Knowledge of General Education information will improve Description: Including pain rating scale, medication(s)/side effects and non-pharmacologic comfort measures Outcome: Progressing   Problem: Clinical Measurements: Goal: Respiratory complications will improve Outcome: Progressing Goal: Cardiovascular complication will be avoided Outcome: Progressing   Problem: Elimination: Goal: Will not experience complications related to urinary retention Outcome: Progressing   Problem: Pain Managment: Goal: General experience of comfort will improve Outcome: Progressing   Problem: Safety: Goal: Ability to remain free from injury will improve Outcome: Progressing   

## 2019-08-15 LAB — CBC
HCT: 35.1 % — ABNORMAL LOW (ref 36.0–46.0)
Hemoglobin: 11.5 g/dL — ABNORMAL LOW (ref 12.0–15.0)
MCH: 31.2 pg (ref 26.0–34.0)
MCHC: 32.8 g/dL (ref 30.0–36.0)
MCV: 95.1 fL (ref 80.0–100.0)
Platelets: 150 10*3/uL (ref 150–400)
RBC: 3.69 MIL/uL — ABNORMAL LOW (ref 3.87–5.11)
RDW: 11.9 % (ref 11.5–15.5)
WBC: 6 10*3/uL (ref 4.0–10.5)
nRBC: 0 % (ref 0.0–0.2)

## 2019-08-15 LAB — URINE CULTURE: Culture: >=100000 — AB

## 2019-08-15 LAB — BASIC METABOLIC PANEL
Anion gap: 8 (ref 5–15)
BUN: 5 mg/dL — ABNORMAL LOW (ref 6–20)
CO2: 23 mmol/L (ref 22–32)
Calcium: 8.9 mg/dL (ref 8.9–10.3)
Chloride: 105 mmol/L (ref 98–111)
Creatinine, Ser: 0.42 mg/dL — ABNORMAL LOW (ref 0.44–1.00)
GFR calc Af Amer: >60 mL/min (ref >60)
GFR calc non Af Amer: >60 mL/min (ref >60)
Glucose, Bld: 86 mg/dL (ref 70–99)
Potassium: 3.9 mmol/L (ref 3.5–5.1)
Sodium: 136 mmol/L (ref 135–145)

## 2019-08-15 LAB — MAGNESIUM: Magnesium: 2 mg/dL (ref 1.7–2.4)

## 2019-08-15 NOTE — Plan of Care (Signed)

## 2019-08-15 NOTE — Progress Notes (Signed)
Subjective/Chief Complaint: Feel better   Objective: Vital signs in last 24 hours: Temp:  [98 F (36.7 C)-98.4 F (36.9 C)] 98 F (36.7 C) (02/25 0556) Pulse Rate:  [58-68] 58 (02/25 0556) Resp:  [16-18] 16 (02/25 0556) BP: (133-143)/(89-103) 133/94 (02/25 0556) SpO2:  [100 %] 100 % (02/25 0556) Last BM Date: 08/13/19  Intake/Output from previous day: 02/24 0701 - 02/25 0700 In: 1750.1 [P.O.:360; I.V.:1015.4; IV Piggyback:374.7] Out: 1550 [Urine:1550] Intake/Output this shift: No intake/output data recorded.  General appearance: alert and cooperative Resp: clear to auscultation bilaterally Cardio: regular rate and rhythm GI: soft, mild focal RLQ tenderness  Lab Results:  Recent Labs    08/14/19 0424 08/15/19 0310  WBC 8.1 6.0  HGB 10.5* 11.5*  HCT 32.1* 35.1*  PLT 147* 150   BMET Recent Labs    08/14/19 0424 08/15/19 0310  NA 138 136  K 3.4* 3.9  CL 105 105  CO2 24 23  GLUCOSE 96 86  BUN 10 5*  CREATININE 0.55 0.42*  CALCIUM 8.8* 8.9   PT/INR No results for input(s): LABPROT, INR in the last 72 hours. ABG No results for input(s): PHART, HCO3 in the last 72 hours.  Invalid input(s): PCO2, PO2  Studies/Results: CT ABDOMEN PELVIS W CONTRAST  Result Date: 08/13/2019 CLINICAL DATA:  Generalized abdominal pain. Sudden onset 24 hours prior. Nausea and vomiting. EXAM: CT ABDOMEN AND PELVIS WITH CONTRAST TECHNIQUE: Multidetector CT imaging of the abdomen and pelvis was performed using the standard protocol following bolus administration of intravenous contrast. CONTRAST:  60mL OMNIPAQUE IOHEXOL 300 MG/ML  SOLN COMPARISON:  Ultrasound 08/13/2019 FINDINGS: Lower chest: Lung bases are clear. Hepatobiliary: No focal hepatic lesion. No biliary duct dilatation. Gallbladder is normal. Common bile duct is normal. Pancreas: Pancreas is normal. No ductal dilatation. No pancreatic inflammation. Spleen: Normal spleen Adrenals/urinary tract: Adrenal glands normal. Focus  of renal cortical scarring in the mid RIGHT kidney best seen on coronal image 74/5. There multiple bilateral simple fluid renal cysts. There is mild hydronephrosis in the upper pole of the LEFT and RIGHT kidney. No duplicated collecting system identified. No evidence acute pyelonephritis. Ureters and bladder normal. Stomach/Bowel: Stomach, duodenum small-bowel normal. Contrast flows through the terminal ileum into the cecum. There is mucosal thickening at the base the cecum (image 60/2). The mucosal thickening measures up to 10 mm (image 45/5). The appendix is stool-filled and distended to 14 mm (image 57/2). Towards the tip of the appendix there is mucosal hyperemia with the appendix measuring 10 mm (image 54/2). There is gas the tip of the appendix). Appendix: Location: Inferior to the cecum.  Typical location Diameter: 14 mm Appendicolith: Absent Mucosal hyper-enhancement: Present Extraluminal gas: Absent Periappendiceal collection: Absent No evidence of perforation or abscess. The ascending, transverse and descending colon normal. Vascular/Lymphatic: Abdominal aorta is normal caliber. There is no retroperitoneal or periportal lymphadenopathy. No pelvic lymphadenopathy. Reproductive: Uterus and ovaries normal Other: No free fluid. Musculoskeletal: No aggressive osseous lesion. IMPRESSION: 1. Acute appendicitis with distended mildly inflamed appendix. No perforation or abscess. 2. Heaped up mucosal thickening at the base of the cecum around the orifice of the appendix. Differential includes mucosal edema related to acute appendicitis or mucosal lesion occluding the orifice of the appendix with secondary appendicitis. 3. Renal cortical scarring in the mid RIGHT kidney. Mild hydronephrosis the pole of the kidneys. Findings could relate to vesicoureteral reflux. No duplicated system identifie on this study. 4. Multiple bilateral renal cysts. Findings conveyed toSHAWN JOY on 08/13/2019  at15:03.  Electronically Signed    By: Suzy Bouchard M.D.   On: 08/13/2019 15:04   US Abdomen Limited RUQ  Result Date: 08/13/2019 CLINICAL DATA:  Mid epigastric abdominal pain for 24 hours EXAM: ULTRASOUND ABDOMEN LIMITED RIGHT UPPER QUADRANT COMPARISON:  None. FINDINGS: Gallbladder: No gallstones or wall thickening visualized. No sonographic Murphy sign noted by sonographer. Common bile duct: Diameter: 2 mm Liver: No focal lesion identified. Within normal limits in parenchymal echogenicity. Portal vein is patent on color Doppler imaging with normal direction of blood flow towards the liver. IMPRESSION: Negative right upper quadrant ultrasound. Electronically Signed   By: Monte Fantasia M.D.   On: 08/13/2019 11:25    Anti-infectives: Anti-infectives (From admission, onward)   Start     Dose/Rate Route Frequency Ordered Stop   08/14/19 1600  cefTRIAXone (ROCEPHIN) 2 g in sodium chloride 0.9 % 100 mL IVPB     2 g 200 mL/hr over 30 Minutes Intravenous Every 24 hours 08/13/19 1732     08/14/19 0100  metroNIDAZOLE (FLAGYL) IVPB 500 mg     500 mg 100 mL/hr over 60 Minutes Intravenous Every 8 hours 08/13/19 1732     08/13/19 1515  cefTRIAXone (ROCEPHIN) 2 g in sodium chloride 0.9 % 100 mL IVPB     2 g 200 mL/hr over 30 Minutes Intravenous  Once 08/13/19 1505 08/13/19 1701   08/13/19 1515  metroNIDAZOLE (FLAGYL) IVPB 500 mg     500 mg 100 mL/hr over 60 Minutes Intravenous  Once 08/13/19 1505 08/13/19 1806      Assessment/Plan: s/p * No surgery found * npo after midnight  Plan for lap appy tomorrow Continue to hold plavix Risks and benefits of the surgery discussed with pt and she understands and wishes to proceed  LOS: 2 days    Autumn Messing III 08/15/2019

## 2019-08-16 ENCOUNTER — Encounter (HOSPITAL_COMMUNITY): Admission: EM | Disposition: A | Discharge status: Home / Self Care | Payer: Self-pay | Source: Home / Self Care

## 2019-08-16 ENCOUNTER — Inpatient Hospital Stay (HOSPITAL_COMMUNITY): Payer: Self-pay | Admitting: Certified Registered Nurse Anesthetist

## 2019-08-16 HISTORY — PX: LAPAROSCOPIC APPENDECTOMY: SHX408

## 2019-08-16 SURGERY — APPENDECTOMY, LAPAROSCOPIC
Anesthesia: General

## 2019-08-16 MED ORDER — OXYCODONE HCL 5 MG PO TABS
5.0000 mg | ORAL_TABLET | ORAL | Status: DC | PRN
  Administered 2019-08-16 – 2019-08-18 (×6): 10 mg via ORAL
  Filled 2019-08-16 (×6): qty 2

## 2019-08-16 MED ORDER — OXYCODONE HCL 5 MG PO TABS: 5.0000 mg | ORAL_TABLET | Freq: Once | ORAL | Status: DC | PRN

## 2019-08-16 MED ORDER — FENTANYL CITRATE (PF) 100 MCG/2ML IJ SOLN
25.0000 ug | INTRAMUSCULAR | Status: DC | PRN
  Administered 2019-08-16 (×3): 50 ug via INTRAVENOUS

## 2019-08-16 MED ORDER — SUGAMMADEX SODIUM 200 MG/2ML IV SOLN
INTRAVENOUS | Status: DC | PRN
  Administered 2019-08-16: 98 mg via INTRAVENOUS

## 2019-08-16 MED ORDER — DEXAMETHASONE SODIUM PHOSPHATE 10 MG/ML IJ SOLN
INTRAMUSCULAR | Status: AC
  Filled 2019-08-16: qty 1

## 2019-08-16 MED ORDER — LIDOCAINE 2% (20 MG/ML) 5 ML SYRINGE
INTRAMUSCULAR | Status: AC
  Filled 2019-08-16: qty 5

## 2019-08-16 MED ORDER — KETOROLAC TROMETHAMINE 15 MG/ML IJ SOLN
15.0000 mg | Freq: Four times a day (QID) | INTRAMUSCULAR | Status: DC | PRN
  Administered 2019-08-17: 15 mg via INTRAVENOUS
  Filled 2019-08-16: qty 1

## 2019-08-16 MED ORDER — SODIUM CHLORIDE 0.9 % IR SOLN
Status: DC | PRN
  Administered 2019-08-16: 2000 mL

## 2019-08-16 MED ORDER — DEXAMETHASONE SODIUM PHOSPHATE 10 MG/ML IJ SOLN
INTRAMUSCULAR | Status: DC | PRN
  Administered 2019-08-16: 10 mg via INTRAVENOUS

## 2019-08-16 MED ORDER — ROCURONIUM BROMIDE 10 MG/ML (PF) SYRINGE
PREFILLED_SYRINGE | INTRAVENOUS | Status: AC
  Filled 2019-08-16: qty 10

## 2019-08-16 MED ORDER — LACTATED RINGERS IV SOLN: INTRAVENOUS | Status: DC

## 2019-08-16 MED ORDER — FENTANYL CITRATE (PF) 100 MCG/2ML IJ SOLN
INTRAMUSCULAR | Status: DC | PRN
  Administered 2019-08-16: 50 ug via INTRAVENOUS
  Administered 2019-08-16: 100 ug via INTRAVENOUS
  Administered 2019-08-16 (×2): 25 ug via INTRAVENOUS
  Administered 2019-08-16: 50 ug via INTRAVENOUS

## 2019-08-16 MED ORDER — ESMOLOL HCL 100 MG/10ML IV SOLN
INTRAVENOUS | Status: AC
  Filled 2019-08-16: qty 10

## 2019-08-16 MED ORDER — INDIGOTINDISULFONATE SODIUM 8 MG/ML IJ SOLN
INTRAMUSCULAR | Status: AC
  Filled 2019-08-16: qty 5

## 2019-08-16 MED ORDER — MIDAZOLAM HCL 2 MG/2ML IJ SOLN
INTRAMUSCULAR | Status: AC
  Filled 2019-08-16: qty 2

## 2019-08-16 MED ORDER — FENTANYL CITRATE (PF) 250 MCG/5ML IJ SOLN
INTRAMUSCULAR | Status: AC
  Filled 2019-08-16: qty 5

## 2019-08-16 MED ORDER — ONDANSETRON HCL 4 MG/2ML IJ SOLN
INTRAMUSCULAR | Status: DC | PRN
  Administered 2019-08-16: 4 mg via INTRAVENOUS

## 2019-08-16 MED ORDER — BUPIVACAINE HCL (PF) 0.25 % IJ SOLN
INTRAMUSCULAR | Status: AC
  Filled 2019-08-16: qty 30

## 2019-08-16 MED ORDER — SUCCINYLCHOLINE CHLORIDE 200 MG/10ML IV SOSY
PREFILLED_SYRINGE | INTRAVENOUS | Status: AC
  Filled 2019-08-16: qty 10

## 2019-08-16 MED ORDER — ROCURONIUM BROMIDE 50 MG/5ML IV SOSY
PREFILLED_SYRINGE | INTRAVENOUS | Status: DC | PRN
  Administered 2019-08-16: 20 mg via INTRAVENOUS
  Administered 2019-08-16: 50 mg via INTRAVENOUS

## 2019-08-16 MED ORDER — LACTATED RINGERS IR SOLN
Status: DC | PRN
  Administered 2019-08-16: 1000 mL

## 2019-08-16 MED ORDER — ONDANSETRON HCL 4 MG/2ML IJ SOLN
INTRAMUSCULAR | Status: AC
  Filled 2019-08-16: qty 2

## 2019-08-16 MED ORDER — BUPIVACAINE HCL 0.25 % IJ SOLN
INTRAMUSCULAR | Status: DC | PRN
  Administered 2019-08-16: 24 mL

## 2019-08-16 MED ORDER — OXYCODONE HCL 5 MG/5ML PO SOLN: 5.0000 mg | Freq: Once | ORAL | Status: DC | PRN

## 2019-08-16 MED ORDER — LACTATED RINGERS IV SOLN: INTRAVENOUS | Status: DC | PRN

## 2019-08-16 MED ORDER — ACETAMINOPHEN 325 MG PO TABS
650.0000 mg | ORAL_TABLET | Freq: Four times a day (QID) | ORAL | Status: DC
  Administered 2019-08-16 – 2019-08-18 (×4): 650 mg via ORAL
  Filled 2019-08-16 (×4): qty 2

## 2019-08-16 MED ORDER — ESMOLOL HCL 100 MG/10ML IV SOLN
INTRAVENOUS | Status: DC | PRN
  Administered 2019-08-16: 20 mg via INTRAVENOUS
  Administered 2019-08-16: 30 mg via INTRAVENOUS
  Administered 2019-08-16: 20 mg via INTRAVENOUS

## 2019-08-16 MED ORDER — PROPOFOL 10 MG/ML IV BOLUS
INTRAVENOUS | Status: DC | PRN
  Administered 2019-08-16: 200 mg via INTRAVENOUS

## 2019-08-16 MED ORDER — ONDANSETRON HCL 4 MG/2ML IJ SOLN: 4.0000 mg | Freq: Once | INTRAMUSCULAR | Status: DC | PRN

## 2019-08-16 MED ORDER — HYDROMORPHONE HCL 1 MG/ML IJ SOLN
INTRAMUSCULAR | Status: DC | PRN
  Administered 2019-08-16 (×2): .5 mg via INTRAVENOUS

## 2019-08-16 MED ORDER — METHOCARBAMOL 500 MG PO TABS
500.0000 mg | ORAL_TABLET | Freq: Four times a day (QID) | ORAL | Status: DC | PRN
  Administered 2019-08-16: 19:00:00 500 mg via ORAL
  Filled 2019-08-16 (×2): qty 1

## 2019-08-16 MED ORDER — HYDROMORPHONE HCL 2 MG/ML IJ SOLN
INTRAMUSCULAR | Status: AC
  Filled 2019-08-16: qty 1

## 2019-08-16 MED ORDER — MIDAZOLAM HCL 5 MG/5ML IJ SOLN
INTRAMUSCULAR | Status: DC | PRN
  Administered 2019-08-16: 2 mg via INTRAVENOUS

## 2019-08-16 MED ORDER — FENTANYL CITRATE (PF) 100 MCG/2ML IJ SOLN
INTRAMUSCULAR | Status: AC
  Filled 2019-08-16: qty 4

## 2019-08-16 MED ORDER — PROPOFOL 10 MG/ML IV BOLUS
INTRAVENOUS | Status: AC
  Filled 2019-08-16: qty 20

## 2019-08-16 MED ORDER — LIDOCAINE 2% (20 MG/ML) 5 ML SYRINGE
INTRAMUSCULAR | Status: DC | PRN
  Administered 2019-08-16: 60 mg via INTRAVENOUS

## 2019-08-16 SURGICAL SUPPLY — 69 items
ADH SKN CLS APL DERMABOND .7 (GAUZE/BANDAGES/DRESSINGS) ×1
APL PRP STRL LF DISP 70% ISPRP (MISCELLANEOUS) ×1
APPLIER CLIP 9.375 MED OPEN (MISCELLANEOUS) ×3
APPLIER CLIP ROT 10 11.4 M/L (STAPLE)
APR CLP MED 9.3 20 MLT OPN (MISCELLANEOUS) ×1
APR CLP MED LRG 11.4X10 (STAPLE)
BAG SPEC RTRVL LRG 6X4 10 (ENDOMECHANICALS) ×1
CABLE HIGH FREQUENCY MONO STRZ (ELECTRODE) ×3 IMPLANT
CELLS DAT CNTRL 66122 CELL SVR (MISCELLANEOUS) ×1 IMPLANT
CHLORAPREP W/TINT 26 (MISCELLANEOUS) ×3 IMPLANT
CLIP APPLIE 9.375 MED OPEN (MISCELLANEOUS) IMPLANT
CLIP APPLIE ROT 10 11.4 M/L (STAPLE) IMPLANT
CNTNR URN SCR LID CUP LEK RST (MISCELLANEOUS) IMPLANT
CONT SPEC 4OZ STRL OR WHT (MISCELLANEOUS) ×3
COVER MAYO STAND STRL (DRAPES) ×4 IMPLANT
COVER SURGICAL LIGHT HANDLE (MISCELLANEOUS) ×2 IMPLANT
COVER WAND RF STERILE (DRAPES) IMPLANT
CUTTER FLEX LINEAR 45M (STAPLE) IMPLANT
DECANTER SPIKE VIAL GLASS SM (MISCELLANEOUS) ×3 IMPLANT
DERMABOND ADVANCED (GAUZE/BANDAGES/DRESSINGS) ×2
DERMABOND ADVANCED .7 DNX12 (GAUZE/BANDAGES/DRESSINGS) ×1 IMPLANT
DRAPE SHEET LG 3/4 BI-LAMINATE (DRAPES) ×4 IMPLANT
ELECT PENCIL ROCKER SW 15FT (MISCELLANEOUS) ×4 IMPLANT
ELECT REM PT RETURN 15FT ADLT (MISCELLANEOUS) ×3 IMPLANT
ENDOLOOP SUT PDS II  0 18 (SUTURE)
ENDOLOOP SUT PDS II 0 18 (SUTURE) IMPLANT
GLOVE BIO SURGEON STRL SZ7.5 (GLOVE) ×5 IMPLANT
GLOVE BIOGEL PI IND STRL 7.5 (GLOVE) IMPLANT
GLOVE BIOGEL PI INDICATOR 7.5 (GLOVE) ×2
GLOVE SURG SS PI 7.5 STRL IVOR (GLOVE) ×2 IMPLANT
GOWN STRL REUS W/TWL XL LVL3 (GOWN DISPOSABLE) ×6 IMPLANT
HANDLE SUCTION POOLE (INSTRUMENTS) IMPLANT
KIT BASIN OR (CUSTOM PROCEDURE TRAY) ×5 IMPLANT
KIT TURNOVER KIT A (KITS) IMPLANT
PENCIL SMOKE EVACUATOR (MISCELLANEOUS) IMPLANT
POUCH SPECIMEN RETRIEVAL 10MM (ENDOMECHANICALS) ×3 IMPLANT
RELOAD 45 VASCULAR/THIN (ENDOMECHANICALS) IMPLANT
RELOAD PROXIMATE 75MM BLUE (ENDOMECHANICALS) ×9 IMPLANT
RELOAD STAPLE 45 2.5 WHT GRN (ENDOMECHANICALS) IMPLANT
RELOAD STAPLE 45 3.5 BLU ETS (ENDOMECHANICALS) IMPLANT
RELOAD STAPLE 60 BLK VRY/THCK (STAPLE) IMPLANT
RELOAD STAPLE 75 3.8 BLU REG (ENDOMECHANICALS) IMPLANT
RELOAD STAPLE TA45 3.5 REG BLU (ENDOMECHANICALS) IMPLANT
RELOAD STAPLER 60MM BLK (STAPLE) ×2 IMPLANT
RETRACTOR WND ALEXIS 18 MED (MISCELLANEOUS) IMPLANT
RTRCTR WOUND ALEXIS 18CM MED (MISCELLANEOUS) ×3
SCISSORS LAP 5X35 DISP (ENDOMECHANICALS) ×3 IMPLANT
SET IRRIG TUBING LAPAROSCOPIC (IRRIGATION / IRRIGATOR) ×3 IMPLANT
SET TUBE SMOKE EVAC HIGH FLOW (TUBING) ×3 IMPLANT
SHEARS HARMONIC ACE PLUS 36CM (ENDOMECHANICALS) ×3 IMPLANT
SLEEVE SCD COMPRESS KNEE MED (MISCELLANEOUS) ×2 IMPLANT
SPONGE LAP 18X18 RF (DISPOSABLE) ×2 IMPLANT
STAPLE ECHEON FLEX 60 POW ENDO (STAPLE) ×2 IMPLANT
STAPLER GUN LINEAR PROX 60 (STAPLE) ×2 IMPLANT
STAPLER PROXIMATE 75MM BLUE (STAPLE) ×2 IMPLANT
STAPLER RELOAD 60MM BLK (STAPLE) ×6
SUCTION POOLE HANDLE (INSTRUMENTS) ×3
SUT MNCRL AB 4-0 PS2 18 (SUTURE) ×3 IMPLANT
SUT PDS AB 1 TP1 96 (SUTURE) ×4 IMPLANT
SUT SILK 2 0SH CR/8 30 (SUTURE) ×2 IMPLANT
SUT SILK 3 0 SH CR/8 (SUTURE) ×2 IMPLANT
SYR BULB IRRIGATION 50ML (SYRINGE) ×2 IMPLANT
TOWEL OR 17X26 10 PK STRL BLUE (TOWEL DISPOSABLE) ×3 IMPLANT
TRAY FOLEY MTR SLVR 14FR STAT (SET/KITS/TRAYS/PACK) ×2 IMPLANT
TRAY FOLEY MTR SLVR 16FR STAT (SET/KITS/TRAYS/PACK) IMPLANT
TRAY LAPAROSCOPIC (CUSTOM PROCEDURE TRAY) ×3 IMPLANT
TROCAR BLADELESS OPT 5 100 (ENDOMECHANICALS) ×3 IMPLANT
TROCAR XCEL BLUNT TIP 100MML (ENDOMECHANICALS) ×3 IMPLANT
YANKAUER SUCT BULB TIP 10FT TU (MISCELLANEOUS) ×4 IMPLANT

## 2019-08-16 NOTE — Anesthesia Procedure Notes (Signed)
Procedure Name: Intubation Date/Time: 08/16/2019 12:50 PM Performed by: Maxwell Caul, CRNA Pre-anesthesia Checklist: Patient identified, Emergency Drugs available, Suction available and Patient being monitored Patient Re-evaluated:Patient Re-evaluated prior to induction Oxygen Delivery Method: Circle system utilized Preoxygenation: Pre-oxygenation with 100% oxygen Induction Type: IV induction Ventilation: Mask ventilation without difficulty Laryngoscope Size: Mac and 4 Tube type: Oral Tube size: 7.5 mm Number of attempts: 1 Airway Equipment and Method: Stylet Placement Confirmation: ETT inserted through vocal cords under direct vision,  positive ETCO2 and breath sounds checked- equal and bilateral Secured at: 22 cm Tube secured with: Tape Dental Injury: Teeth and Oropharynx as per pre-operative assessment

## 2019-08-16 NOTE — H&P (View-Only) (Signed)
Patient ID: Janice Esparza, female   DOB: [birth year], 29 y.o.   MRN: [id number] Plan for lap appy today. Risks and benefits of the surgery discussed with pt including the risk of leak and need for larger open surgery and she understands and wishes to proceed

## 2019-08-16 NOTE — Anesthesia Preprocedure Evaluation (Signed)
Anesthesia Evaluation  Patient identified by MRN, date of birth, ID band Patient awake    Reviewed: Allergy & Precautions, NPO status , Patient's Chart, lab work & pertinent test results  History of Anesthesia Complications Negative for: history of anesthetic complications  Airway Mallampati: II  TM Distance: >3 FB Neck ROM: Full    Dental  (+) Teeth Intact   Pulmonary neg pulmonary ROS, former smoker,    Pulmonary exam normal        Cardiovascular hypertension, + Past MI  Normal cardiovascular exam  MI in January 2021, believed secondary to coronary vasospasm   Neuro/Psych negative neurological ROS  negative psych ROS   GI/Hepatic negative GI ROS, Neg liver ROS,   Endo/Other  negative endocrine ROS  Renal/GU negative Renal ROS  negative genitourinary   Musculoskeletal negative musculoskeletal ROS (+)   Abdominal   Peds  Hematology negative hematology ROS (+)   Anesthesia Other Findings   Reproductive/Obstetrics                            Anesthesia Physical Anesthesia Plan  ASA: III  Anesthesia Plan: General   Post-op Pain Management:    Induction: Intravenous  PONV Risk Score and Plan: 3 and Ondansetron, Dexamethasone, Treatment may vary due to age or medical condition and Midazolam  Airway Management Planned: Oral ETT  Additional Equipment: None  Intra-op Plan:   Post-operative Plan: Extubation in OR  Informed Consent: I have reviewed the patients History and Physical, chart, labs and discussed the procedure including the risks, benefits and alternatives for the proposed anesthesia with the patient or authorized representative who has indicated his/her understanding and acceptance.     Dental advisory given  Plan Discussed with:   Anesthesia Plan Comments:         Anesthesia Quick Evaluation

## 2019-08-16 NOTE — Progress Notes (Signed)
Patient ID: Janice Esparza, female   DOB: 12/19/1990, 28 y.o.   MRN: [id number] Plan for lap appy today. Risks and benefits of the surgery discussed with pt including the risk of leak and need for larger open surgery and she understands and wishes to proceed 

## 2019-08-16 NOTE — Op Note (Signed)
08/16/2019  3:17 PM  PATIENT:  Janice Esparza  29 y.o. female  PRE-OPERATIVE DIAGNOSIS:  APPENDICITIS  POST-OPERATIVE DIAGNOSIS:  APPENDICITIS  PROCEDURE:  Procedure(s): ATTEMPTED LAPAROSCOPIC APPENDECTOMY /OPEN ILEOCECTOMY (N/A)  SURGEON:  Surgeon(s) and Role:    * Jovita Kussmaul, MD - Primary  PHYSICIAN ASSISTANT:   ASSISTANTS: Margie Billet, PA   ANESTHESIA:   local and general  EBL:  100 mL   BLOOD ADMINISTERED:none  DRAINS: none   LOCAL MEDICATIONS USED:  MARCAINE     SPECIMEN:  Source of Specimen:  terminal ileum and cecum with appendicitis  DISPOSITION OF SPECIMEN:  PATHOLOGY  COUNTS:  YES  TOURNIQUET:  * No tourniquets in log *  DICTATION: .Dragon Dictation   After informed consent was obtained patient was brought to the operating room placed in the supine position on the operating room table. After adequate induction of general anesthesia the patient's abdomen was prepped with ChloraPrep, allowed to dry, and draped in usual sterile manner. The area below the umbilicus was infiltrated with quarter percent Marcaine. A small incision was made with a 15 blade knife. This incision was carried down through the subcutaneous tissue bluntly with a hemostat and [employer] retractors until the linea alba was identified. The linea alba was incised with a 15 blade knife. Each side was grasped Coker clamps and elevated anteriorly. The preperitoneal space was probed bluntly with a hemostat until the peritoneum was opened and access was gained to the abdominal cavity. A 0 Vicryl purse string stitch was placed in the fascia surrounding the opening. A Hassan cannula was placed through the opening and anchored in place with the previously placed Vicryl purse string stitch. The laparoscope was placed through the [facility] cannula. The abdomen was insufflated with carbon dioxide without difficulty. Next the suprapubic area was infiltrated with quarter percent Marcaine. A small incision was  made with a 15 blade knife. A 5 mm port was placed bluntly through this incision into the abdominal cavity. A site was then chosen between the 2 port for placement of a 5 mm port. The area was infiltrated with quarter percent Marcaine. A small stab incision was made with a 15 blade knife. A 5 mm port was placed bluntly through this incision and the abdominal cavity under direct vision. The laparoscope was then moved to the suprapubic port. Using a Glassman grasper and harmonic scalpel the right lower quadrant was inspected. The appendix was readily identified.  The appendix was very thick and inflamed and adherent to the pelvic sidewall.  I was able to open the mesentery of the appendix sharply with the harmonic scalpel and then freed the appendix from the pelvic sidewall by combination of blunt dissection and some sharp dissection with the harmonic scalpel.  Once the appendix was up I examined the base of the appendix and it was very very thick and inflamed and I was concerned that it would not be able to take a stapler.  I mobilized the right colon by incising its retroperitoneal attachment along the white line of Toldt.  I made 1 attempt at passing a black load stapler across the cecum but it was too thick.  Because of this I ended up having to open.  I made a small lower midline incision just below the umbilicus with the 10 blade knife and electrocautery and opened through the [facility] cannula port site.  I placed a wound protector.  I was then able to bring the entire right colon and terminal ileum  up into the wound since the mobilization had already been done.  I chose a site on the distal terminal ileum where the small bowel was normal feeling and another site on the cecum where the colon felt normal.  The mesentery at each of these points was opened sharply with electrocautery.  A GIA-75 stapler was placed across to each portion of intestine, clamped, and fired thereby dividing the intestine between staple  lines.  The mesentery to the terminal ileum and cecal area was then taken down sharply with the harmonic scalpel.  The terminal ileum and cecum with the appendix was then removed from the patient and sent to pathology.  The terminal ileum easily approximated the right colon.  A small opening was made on the antimesenteric border of each limb of small bowel and right colon.  Each limb of a GIA-75 stapler was then placed down the appropriate limb of small bowel and colon, clamped, and fired thereby creating a nice widely patent enteroenterostomy.  The common opening was closed with a single firing of a TA 60 stapler.  The mesentery was then closed with a 2-0 silk figure-of-eight stitch.  The staple line was imbricated with multiple 2-0 silk Lembert stitches and a 2-0 silk crotch stitch was also placed.  The abdomen was then inspected.  I was able to identify the ureter and made sure that it was away from the operative area and uninjured.  What looked like a small gonadal vessel was divided during the mobilization of the appendix and this was controlled with a clip.  The anastomosis appeared healthy without tension and had good blood supply.  The anastomosis was placed back in the right lower quadrant.  The abdomen is irrigated with copious amounts of saline.  The area was examined and seem to be hemostatic.  The fascia of the anterior abdominal wall was then closed with 2 running #1 double-stranded looped PDS sutures.  The subcutaneous tissue was irrigated with saline and the skin incisions were closed with 4-0 Monocryl subcuticular stitches.  Dermabond dressings were applied.  The patient tolerated the procedure well.  At the end of the case all needle sponge and instrument counts were correct.  The patient was then awakened and taken to recovery in stable condition.  PLAN OF CARE: Admit to inpatient   PATIENT DISPOSITION:  PACU - hemodynamically stable.   Delay start of Pharmacological VTE agent (>24hrs) due  to surgical blood loss or risk of bleeding: no

## 2019-08-16 NOTE — Anesthesia Postprocedure Evaluation (Signed)
Anesthesia Post Note  Patient: Janice Esparza  Procedure(s) Performed: ATTEMPTED LAPAROSCOPIC APPENDECTOMY [facility]ald [facility]ge ILEOCECTOMY (N/A )     Patient location during evaluation: PACU Anesthesia Type: General Level of consciousness: awake and alert Pain management: pain level controlled Vital Signs Assessment: post-procedure vital signs reviewed and stable Respiratory status: spontaneous breathing, nonlabored ventilation and respiratory function stable Cardiovascular status: blood pressure returned to baseline and stable Postop Assessment: no apparent nausea or vomiting Anesthetic complications: no    Last Vitals:  Vitals:   08/16/19 1600 08/16/19 1705  BP: (!) 150/105 (!) 147/100  Pulse: 80 80  Resp: 15 14  Temp: 37.1 C 36.7 C  SpO2: 100% 100%    Last Pain:  Vitals:   08/16/19 1705  TempSrc: Oral  PainSc:                  Lucretia Kern

## 2019-08-16 NOTE — Plan of Care (Signed)

## 2019-08-16 NOTE — Discharge Instructions (Addendum)
[facility], [place] [phone]  ABDOMINAL SURGERY: POST OP INSTRUCTIONS  Always review your discharge instruction sheet given to you by the facility where your surgery was performed.  IF YOU HAVE DISABILITY OR FAMILY LEAVE FORMS, YOU MUST BRING THEM TO THE OFFICE FOR PROCESSING.  PLEASE DO NOT GIVE THEM TO YOUR DOCTOR.  1. A prescription for pain medication may be given to you upon discharge.  Take your pain medication as prescribed, if needed.  If narcotic pain medicine is not needed, then you may take acetaminophen (Tylenol) or ibuprofen (Advil) as needed. 2. Take your usually prescribed medications unless otherwise directed. 3. If you need a refill on your pain medication, please contact your pharmacy. They will contact our office to request authorization.  Prescriptions will not be filled after 5pm or on week-ends. 4. You should follow a light diet the first few days after arrival home, such as soup and crackers, pudding, etc.unless your doctor has advised otherwise. A high-fiber, low fat diet can be resumed as tolerated.   Be sure to include lots of fluids daily. Most patients will experience some swelling and bruising on the chest and neck area.  Ice packs will help.  Swelling and bruising can take several days to resolve 5. Most patients will experience some swelling and bruising in the area of the incision. Ice pack will help. Swelling and bruising can take several days to resolve..  6. It is common to experience some constipation if taking pain medication after surgery.  Increasing fluid intake and taking a stool softener will usually help or prevent this problem from occurring.  A mild laxative (Milk of Magnesia or Miralax) should be taken according to package directions if there are no bowel movements after 48 hours. 7. If your surgeon used skin glue on the incision, you may shower in 24 hours.  The glue will flake off over the next 2-3 weeks.  You may find that a light  gauze bandage over your incision may keep your staples from being rubbed or pulled. You may shower and replace the bandage daily. 8. ACTIVITIES:  You may resume regular (light) daily activities beginning the next day--such as daily self-care, walking, climbing stairs--gradually increasing activities as tolerated.  You may have sexual intercourse when it is comfortable.  Refrain from any heavy lifting or straining until approved by your doctor. a. You may drive when you no longer are taking prescription pain medication, you can comfortably wear a seatbelt, and you can safely maneuver your car and apply brakes 9. You should see your doctor in the office for a follow-up appointment approximately two weeks after your surgery.  Make sure that you call for this appointment within a day or two after you arrive home to insure a convenient appointment time. OTHER INSTRUCTIONS:  _____________________________________________________________ _____________________________________________________________  WHEN TO CALL YOUR DOCTOR: 1. Fever over 101.0 2. Inability to urinate 3. Nausea and/or vomiting 4. Extreme swelling or bruising 5. Continued bleeding from incision. 6. Increased pain, redness, or drainage from the incision. 7. Difficulty swallowing or breathing 8. Muscle cramping or spasms. 9. Numbness or tingling in hands or feet or around lips.  The clinic staff is available to answer your questions during regular business hours.  Please don't hesitate to call and ask to speak to one of the nurses if you have concerns.  For further questions, please visit [email or web address]   Excuse from Work, [employer], or Physical Activity __________[name]_____________________________________________ needs to be  excused from: _x___ Work. ____ School. ____ Physical activity. This is effective for the following dates: __________2/28/2021- 09/01/2019  ____________________________. ______________________________________________________________________________ ____ He or she may return to full physical activity on _______3/14/2021_________. Health care provider name (printed): ___________Paul Carolynne Edouard MD __________________________________________ Date: ______________2/28/2021________________ This information is not intended to replace advice given to you by your health care provider. Make sure you discuss any questions you have with your health care provider. Document Revised: 06/01/2017 Document Reviewed: 06/01/2017 Elsevier Patient Education  2020 [employer].

## 2019-08-16 NOTE — Interval H&P Note (Signed)
History and Physical Interval Note:  08/16/2019 12:04 PM  Janice Esparza  has presented today for surgery, with the diagnosis of APPENDICITIS.  The various methods of treatment have been discussed with the patient and family. After consideration of risks, benefits and other options for treatment, the patient has consented to  Procedure(s): LAPAROSCOPIC APPENDECTOMY POSSIBLE OPEN (N/A) as a surgical intervention.  The patient's history has been reviewed, patient examined, no change in status, stable for surgery.  I have reviewed the patient's chart and labs.  Questions were answered to the patient's satisfaction.     Chevis Pretty III

## 2019-08-16 NOTE — Progress Notes (Signed)
Patient asking to be discharged home tonight. Says "I just want to lay in my own bed" educated patient that this is unsafe idea r/t pain control, nausea, and patient has not progressed diet. Patient demonstrates understanding and agrees to stay tonight. Call bell within reach. Bed alarm on.

## 2019-08-16 NOTE — Transfer of Care (Signed)
Immediate Anesthesia Transfer of Care Note  Patient: Janice Esparza  Procedure(s) Performed: Procedure(s): ATTEMPTED LAPAROSCOPIC APPENDECTOMY /OPEN ILEOCECTOMY (N/A)  Patient Location: PACU  Anesthesia Type:General  Level of Consciousness:  sedated, patient cooperative and responds to stimulation  Airway & Oxygen Therapy:Patient Spontanous Breathing and Patient connected to face mask oxgen  Post-op Assessment:  Report given to PACU RN and Post -op Vital signs reviewed and stable  Post vital signs:  Reviewed and stable  Last Vitals:  Vitals:   08/16/19 1000 08/16/19 1532  BP: (!) 144/115 (!) 160/110  Pulse:  88  Resp:  10  Temp:  36.8 C  SpO2:  100%    Complications: No apparent anesthesia complications

## 2019-08-17 LAB — BASIC METABOLIC PANEL
Anion gap: 10 (ref 5–15)
BUN: 7 mg/dL (ref 6–20)
CO2: 24 mmol/L (ref 22–32)
Calcium: 9.2 mg/dL (ref 8.9–10.3)
Chloride: 101 mmol/L (ref 98–111)
Creatinine, Ser: 0.53 mg/dL (ref 0.44–1.00)
GFR calc Af Amer: >60 mL/min (ref >60)
GFR calc non Af Amer: >60 mL/min (ref >60)
Glucose, Bld: 127 mg/dL — ABNORMAL HIGH (ref 70–99)
Potassium: 4 mmol/L (ref 3.5–5.1)
Sodium: 135 mmol/L (ref 135–145)

## 2019-08-17 LAB — CBC
HCT: 36.3 % (ref 36.0–46.0)
Hemoglobin: 12 g/dL (ref 12.0–15.0)
MCH: 30.9 pg (ref 26.0–34.0)
MCHC: 33.1 g/dL (ref 30.0–36.0)
MCV: 93.6 fL (ref 80.0–100.0)
Platelets: 197 10*3/uL (ref 150–400)
RBC: 3.88 MIL/uL (ref 3.87–5.11)
RDW: 11.5 % (ref 11.5–15.5)
WBC: 15 10*3/uL — ABNORMAL HIGH (ref 4.0–10.5)
nRBC: 0 % (ref 0.0–0.2)

## 2019-08-17 MED ORDER — CLOPIDOGREL BISULFATE 75 MG PO TABS
75.0000 mg | ORAL_TABLET | Freq: Every day | ORAL | Status: DC
  Administered 2019-08-17 – 2019-08-18 (×2): 75 mg via ORAL
  Filled 2019-08-17 (×2): qty 1

## 2019-08-17 MED ORDER — ASPIRIN EC 81 MG PO TBEC
81.0000 mg | DELAYED_RELEASE_TABLET | Freq: Every day | ORAL | Status: DC
  Administered 2019-08-17 – 2019-08-18 (×2): 81 mg via ORAL
  Filled 2019-08-17 (×2): qty 1

## 2019-08-17 NOTE — Progress Notes (Signed)
Patient continues to be irate with staff, and cussing.   States "I just want to go home"    Offered education related to elevated WBC count, and reason for staying in the hospital today.   Will continue to assess and care for patient, and provide education related to care being provided.   Patient alert, oriented, and safe in bed at this time.    SWhittemore, Charity fundraiser

## 2019-08-17 NOTE — Progress Notes (Signed)
Patient requested to restart her aspirin and plavix instead of the scheduled lovenox.  Paged attending physician about patient request.  Physician agreeable, and orders were placed.  Will inform patient of medication change.      SWhittemore, Charity fundraiser

## 2019-08-17 NOTE — Plan of Care (Signed)
Plan of care for post op day 1 discussed with patient.   Patient agreeable.   Patient needs a note for work from the attending physician.    Will continue to monitor patient.      SWhittemore, Charity fundraiser

## 2019-08-17 NOTE — Progress Notes (Signed)
1 Day Post-Op   Subjective/Chief Complaint: I want to go home Pt verbally abusive to staff / profanity bursts   Angry about being in the hospital POD from ileocecectomy and had a WBC 15,000 Not on regular diet  Complains of abdominal pain No BM    Objective: Vital signs in last 24 hours: Temp:  [97.8 F (36.6 C)-98.7 F (37.1 C)] 97.8 F (36.6 C) (02/27 0928) Pulse Rate:  [65-88] 84 (02/27 0928) Resp:  [10-18] 16 (02/27 0928) BP: (129-172)/(82-110) 150/105 (02/27 0928) SpO2:  [100 %] 100 % (02/27 0928) Last BM Date: 08/15/19  Intake/Output from previous day: 02/26 0701 - 02/27 0700 In: 3356.7 [P.O.:480; I.V.:2776.7; IV Piggyback:100] Out: 2800 [Urine:2700; Blood:100] Intake/Output this shift: No intake/output data recorded.  Incision/Wound:incision CDI port sites clean soft flat appropriately tender   Lab Results:  Recent Labs    08/15/19 0310 08/17/19 0349  WBC 6.0 15.0*  HGB 11.5* 12.0  HCT 35.1* 36.3  PLT 150 197   BMET Recent Labs    08/15/19 0310 08/17/19 0349  NA 136 135  K 3.9 4.0  CL 105 101  CO2 23 24  GLUCOSE 86 127*  BUN 5* 7  CREATININE 0.42* 0.53  CALCIUM 8.9 9.2   PT/INR No results for input(s): LABPROT, INR in the last 72 hours. ABG No results for input(s): PHART, HCO3 in the last 72 hours.  Invalid input(s): PCO2, PO2  Studies/Results: No results found.  Anti-infectives: Anti-infectives (From admission, onward)   Start     Dose/Rate Route Frequency Ordered Stop   08/14/19 1600  cefTRIAXone (ROCEPHIN) 2 g in sodium chloride 0.9 % 100 mL IVPB     2 g 200 mL/hr over 30 Minutes Intravenous Every 24 hours 08/13/19 1732     08/14/19 0100  metroNIDAZOLE (FLAGYL) IVPB 500 mg     500 mg 100 mL/hr over 60 Minutes Intravenous Every 8 hours 08/13/19 1732     08/13/19 1515  cefTRIAXone (ROCEPHIN) 2 g in sodium chloride 0.9 % 100 mL IVPB     2 g 200 mL/hr over 30 Minutes Intravenous  Once 08/13/19 1505 08/13/19 1701   08/13/19 1515   metroNIDAZOLE (FLAGYL) IVPB 500 mg     500 mg 100 mL/hr over 60 Minutes Intravenous  Once 08/13/19 1505 08/13/19 1806      Assessment/Plan: s/p Procedure(s): ATTEMPTED LAPAROSCOPIC APPENDECTOMY /OPEN ILEOCECTOMY (N/A) POD 1 lap ileocecectomy  Advance diet SL IV Very angry and abusive toward staff Do not feel discharge safe given elevated WBC and diet not yet advanced  May sign out AMA  LOS: 4 days    Janice Esparza 08/17/2019

## 2019-08-18 LAB — CBC
HCT: 32.3 % — ABNORMAL LOW (ref 36.0–46.0)
Hemoglobin: 10.6 g/dL — ABNORMAL LOW (ref 12.0–15.0)
MCH: 30.9 pg (ref 26.0–34.0)
MCHC: 32.8 g/dL (ref 30.0–36.0)
MCV: 94.2 fL (ref 80.0–100.0)
Platelets: 167 10*3/uL (ref 150–400)
RBC: 3.43 MIL/uL — ABNORMAL LOW (ref 3.87–5.11)
RDW: 11.7 % (ref 11.5–15.5)
WBC: 9.1 10*3/uL (ref 4.0–10.5)
nRBC: 0 % (ref 0.0–0.2)

## 2019-08-18 MED ORDER — OXYCODONE HCL 5 MG PO TABS: 5.0000 mg | ORAL_TABLET | Freq: Four times a day (QID) | ORAL | 0 refills | Status: DC | PRN

## 2019-08-18 NOTE — Progress Notes (Signed)
Pt stable at time of d/c. No needs at time of discharge.

## 2019-08-18 NOTE — Discharge Summary (Signed)
Physician Discharge Summary  Patient ID: Janice Esparza MRN: [id number] DOB/AGE: 31-27-1992 31 y.o.  Admit date: 08/13/2019 Discharge date: 08/18/2019  Admission Diagnoses:acute appendicitis   Discharge Diagnoses:  Active Problems:   Appendicitis   Discharged Condition: good  Hospital Course: Pt admitted on 08/13/2019 with acute appendicitis but was on plaxix and ASA for acute coronary syndrome.  She was evaluated by cardiology and surgery was postponed and she was treated medically to allow plavix to wear off.She underwent ileocecectomy by DrToth on 08/15/2010.  She did well postoperatively.  WBC 15,000 pod 1 but normalized POD2.  She was D/C POD 2 after tolerating diet, having good pain control and bowel function.    Consults: cardiology    Treatments: surgery: laparoscopic ileocecectomy Dr Carolynne Edouard   Discharge Exam: Blood pressure (!) 146/91, pulse 68, temperature 97.8 F (36.6 C), temperature source Oral, resp. rate 15, height 5\' 6"  (1.676 m), weight 49 kg, last menstrual period 08/04/2019, SpO2 100 %. General appearance: alert and cooperative Resp: clear to auscultation bilaterally Cardio: regular rate and rhythm, S1, S2 normal, no murmur, click, rub or gallop Incision/Wound:CDI soft minimally sore abdomen  Disposition: Discharge disposition: 01-Home or Self Care       Discharge Instructions    Diet - low sodium heart healthy   Complete by: As directed    Increase activity slowly   Complete by: As directed      Allergies as of 08/18/2019   No Known Allergies     Medication List    TAKE these medications   aspirin EC 81 MG tablet Take 1 tablet (81 mg total) by mouth daily.   atorvastatin 40 MG tablet Commonly known as: LIPITOR Take 1 tablet (40 mg total) by mouth daily at 6 PM. What changed: when to take this   clopidogrel 75 MG tablet Commonly known as: PLAVIX Take 1 tablet (75 mg total) by mouth daily.   diltiazem 120 MG 24 hr capsule Commonly known as:  CARDIZEM CD Take 1 capsule (120 mg total) by mouth daily.   isosorbide mononitrate 30 MG 24 hr tablet Commonly known as: IMDUR Take 1 tablet (30 mg total) by mouth daily.   nitroGLYCERIN 0.4 MG SL tablet Commonly known as: NITROSTAT Place 1 tablet (0.4 mg total) under the tongue every 5 (five) minutes x 3 doses as needed for chest pain.   oxyCODONE 5 MG immediate release tablet Commonly known as: Oxy IR/ROXICODONE Take 1 tablet (5 mg total) by mouth every 6 (six) hours as needed for severe pain.      Follow-up Information    [facility].   Specialty: Gastroenterology Why: As soon as possible for follow up Contact information: [address] [place] [phone]       [phone], MD. Go on 09/03/2019.   Specialty: General Surgery Why: Your appointment is 3/16 at 9:50am Please arrive 30 minutes prior to your appointment to check in and fill out paperwork. Bring photo ID and insurance information. Contact information: [address] [place] [phone]           Signed: [phone] August Gosser 08/18/2019, 12:10 PM

## 2019-08-19 LAB — SURGICAL PATHOLOGY

## 2019-09-26 ENCOUNTER — Ambulatory Visit: Payer: Self-pay | Admitting: Internal Medicine

## 2019-10-09 ENCOUNTER — Telehealth: Payer: Self-pay | Admitting: *Deleted

## 2019-10-09 NOTE — Telephone Encounter (Signed)
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 She has no symptoms and looking for advice whether she should be retested. Advised quarantine and go for testing(provided [employer] location/address) and # to text for an appointment.

## 2019-10-14 ENCOUNTER — Ambulatory Visit: Payer: Self-pay | Attending: Internal Medicine

## 2019-10-14 DIAGNOSIS — Z20822 Contact with and (suspected) exposure to covid-19: Secondary | ICD-10-CM

## 2019-10-15 ENCOUNTER — Telehealth: Payer: Self-pay

## 2019-10-15 LAB — SARS-COV-2, NAA 2 DAY TAT

## 2019-10-15 LAB — NOVEL CORONAVIRUS, NAA: SARS-CoV-2, NAA: NOT DETECTED

## 2019-10-15 NOTE — Telephone Encounter (Signed)
Pt. Checking on COVID 19 results, not available yet. °

## 2019-10-22 NOTE — Progress Notes (Deleted)
Cardiology Office Note:    Date:  10/22/2019   ID:  Janice Esparza, DOB 04-16-1991, MRN [id number]  PCP:  Patient, No Pcp Per  Cardiologist:  Dr Martinique Electrophysiologist:  None   Referring MD: No ref. provider found   No chief complaint on file.   History of Present Illness:    Janice Esparza is a 29 y.o. female with no hx of CAD or HTN, presented to [facility] 07/17/2019 with SSCP "pressure".  Troponin was elevated- peak 1444.  She had a normal echo.  The next day she had recurrent chest pain and NST changes on her EKG.  Cath showed no significant CAD.  Cardiac MRI was negative for scar or inflammation. Drug screen was negative for cocaine, positive for THC.  She was treated for possible vasospastic disease with  Diltiazem, Imdur, Lipitor, ASA, and Plavix.   In February 2021 she underwent appendectomy for acute appendicitis.   She is seen in the office today in follow up.  She denies any further chest pain.  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 I reviewed her medications with her in detail, she only had a question about the Lipitor dose time, I told her it would be OK to take at the same time as her other medications.  Past Medical History:  Diagnosis Date  . Chest pain 07/17/2019  . Eczema, allergic   . Hypertension   . NSTEMI (non-ST elevated myocardial infarction) ([place])   . Psoriasis   . Tobacco use     Past Surgical History:  Procedure Laterality Date  . LAPAROSCOPIC APPENDECTOMY N/A 08/16/2019   Procedure: ATTEMPTED LAPAROSCOPIC APPENDECTOMY Nancy Nordmann;  Surgeon: Jovita Kussmaul, MD;  Location: [facility];  Service: General;  Laterality: N/A;  . LEFT HEART CATH AND CORONARY ANGIOGRAPHY N/A 07/18/2019   Procedure: LEFT HEART CATH AND CORONARY ANGIOGRAPHY;  Surgeon: Belva Crome, MD;  Location: [facility];  Service: Cardiovascular;  Laterality: N/A;  . NO PAST SURGERIES      Current Medications: No outpatient medications have been marked as  taking for the 10/28/19 encounter (Appointment) with Martinique, Krithi Bray M, MD.     Allergies:   Patient has no known allergies.   Social History   Socioeconomic History  . Marital status: Single    Spouse name: Not on file  . Number of children: Not on file  . Years of education: Not on file  . Highest education level: Not on file  Occupational History  . Not on file  Tobacco Use  . Smoking status: Former Smoker    Packs/day: 0.50    Types: Cigarettes    Quit date: 07/29/2019    Years since quitting: 0.2  . Smokeless tobacco: Never Used  Substance and Sexual Activity  . Alcohol use: Yes    Comment: 3 times a week; last drink last night   . Drug use: Yes    Types: Marijuana    Comment: Daily   . Sexual activity: Yes    Birth control/protection: None  Other Topics Concern  . Not on file  Social History Narrative  . Not on file   Social Determinants of Health   Financial Resource Strain:   . Difficulty of Paying Living Expenses:   Food Insecurity:   . Worried About Charity fundraiser in the Last Year:   . [place] in the Last Year:   Transportation Needs:   . Lack of  Transportation (Medical):   Marland Kitchen Lack of Transportation (Non-Medical):   Physical Activity:   . Days of Exercise per Week:   . Minutes of Exercise per Session:   Stress:   . Feeling of Stress :   Social Connections:   . Frequency of Communication with Friends and Family:   . Frequency of Social Gatherings with Friends and Family:   . Attends Religious Services:   . Active Member of Clubs or Organizations:   . Attends Banker Meetings:   Marland Kitchen Marital Status:      Family History: The patient's family history includes Arrhythmia in her paternal grandfather; Diabetes in an other family member; Healthy in her father and mother; Heart disease in her paternal grandfather; Hypertension in an other family member.  ROS:   Please see the history of present illness.     All other systems reviewed  and are negative.  EKGs/Labs/Other Studies Reviewed:    The following studies were reviewed today: Cath 07/18/2019 Echo 07/17/2019  EKG:  EKG is ordered today.  The ekg ordered today demonstrates NSR- HR 54, inferior lateral TWI (the TWI in V3 is new).   Recent Labs: 07/17/2019: TSH 1.716 08/13/2019: ALT 19 08/15/2019: Magnesium 2.0 08/17/2019: BUN 7; Creatinine, Ser 0.53; Potassium 4.0; Sodium 135 08/18/2019: Hemoglobin 10.6; Platelets 167  Recent Lipid Panel    Component Value Date/Time   CHOL 148 07/18/2019 0253   TRIG 39 07/18/2019 0253   HDL 78 07/18/2019 0253   CHOLHDL 1.9 07/18/2019 0253   VLDL 8 07/18/2019 0253   LDLCALC 62 07/18/2019 0253    Physical Exam:    VS:  There were no vitals taken for this visit.    Wt Readings from Last 3 Encounters:  08/13/19 108 lb (49 kg)  07/23/19 113 lb (51.3 kg)  07/19/19 106 lb 7.7 oz (48.3 kg)     GEN:  Thin AA female, well developed in no acute distress HEENT: Normal NECK: No JVD; No carotid bruits CARDIAC: RRR, no murmurs, rubs, gallops RESPIRATORY:  Clear to auscultation without rales, wheezing or rhonchi  ABDOMEN: Soft, non-tender, non-distended MUSCULOSKELETAL:  No edema; No deformity  SKIN: Warm and dry NEUROLOGIC:  Alert and oriented x 3 PSYCHIATRIC:  Normal affect   ASSESSMENT:    No problem-specific Assessment & Plan notes found for this encounter.  PLAN:    Same Rx for now.  I counseled against THC use and smoking. F/U 3 months.    Medication Adjustments/Labs and Tests Ordered: Current medicines are reviewed at length with the patient today.  Concerns regarding medicines are outlined above.  No orders of the defined types were placed in this encounter.  No orders of the defined types were placed in this encounter.   There are no Patient Instructions on file for this visit.   Signed, Leandrew Keech [place], MD  10/22/2019 1:38 PM    [facility]

## 2019-10-28 ENCOUNTER — Ambulatory Visit: Payer: Self-pay | Admitting: Cardiology

## 2019-10-29 ENCOUNTER — Other Ambulatory Visit: Payer: Self-pay

## 2019-10-29 ENCOUNTER — Telehealth: Payer: Self-pay | Admitting: Cardiology

## 2019-10-29 MED ORDER — ATORVASTATIN CALCIUM 40 MG PO TABS: 40.0000 mg | ORAL_TABLET | Freq: Every day | ORAL | 3 refills | Status: DC

## 2019-10-29 MED ORDER — ISOSORBIDE MONONITRATE ER 30 MG PO TB24: 30.0000 mg | ORAL_TABLET | Freq: Every day | ORAL | 3 refills | Status: DC

## 2019-10-29 MED ORDER — DILTIAZEM HCL ER COATED BEADS 120 MG PO CP24: 120.0000 mg | ORAL_CAPSULE | Freq: Every day | ORAL | 3 refills | Status: DC

## 2019-10-29 MED ORDER — NITROGLYCERIN 0.4 MG SL SUBL: 0.4000 mg | SUBLINGUAL_TABLET | SUBLINGUAL | 0 refills | Status: DC | PRN

## 2019-10-29 NOTE — Telephone Encounter (Signed)
Pt c/o medication issue:  1. Name of Medication: atorvastatin (LIPITOR) 40 MG tablet, diltiazem (CARDIZEM CD) 120 MG 24 hr capsule, isosorbide mononitrate (IMDUR) 30 MG 24 hr tablet, nitroGLYCERIN (NITROSTAT) 0.4 MG SL tablet  2. How are you currently taking this medication (dosage and times per day)? As directed  3. Are you having a reaction (difficulty breathing--STAT)? no  4. What is your medication issue? Patient states that the refills on this medication are about to run out. She wants to make sure she can get more refills.

## 2019-10-29 NOTE — Telephone Encounter (Signed)
Refills for prescription sent to pharmacy. Pt has appt with dr. Sw[place] on 11/01/19 at 2:20pm

## 2019-10-31 NOTE — Progress Notes (Signed)
Virtual Visit via Telephone Note   This visit type was conducted due to national recommendations for restrictions regarding the COVID-19 Pandemic (e.g. social distancing) in an effort to limit this patient's exposure and mitigate transmission in our community.  Due to her co-morbid illnesses, this patient is at least at moderate risk for complications without adequate follow up.  This format is felt to be most appropriate for this patient at this time.  The patient did not have access to video technology/had technical difficulties with video requiring transitioning to audio format only (telephone).  All issues noted in this document were discussed and addressed.  No physical exam could be performed with this format.  Please refer to the patient's chart for her  consent to telehealth for [facility].   The patient was identified using 2 identifiers.  Date:  11/01/2019   ID:  Talbert Nan, DOB Mar 23, 1991, MRN [id number]  Patient Location: Home Provider Location: Office  PCP:  Patient, No Pcp Per  Cardiologist:  Raphael Fitzpatrick [place], MD  Electrophysiologist:  None   Evaluation Performed:  Follow-Up Visit  Chief Complaint: chest pain  History of Present Illness:    NECHELLE PETRIZZO is a 29 y.o. female with no hx of CAD or HTN, presented to [facility] 07/17/2019 with SSCP "pressure".  Troponin was elevated- peak 1444.  She had a normal echo.  The next day she had recurrent chest pain and NST changes on her EKG.  Cath showed no significant CAD.  Cardiac MRI was negative for scar or inflammation. Drug screen was negative for cocaine, positive for THC.  She was treated for possible vasospastic disease with  Diltiazem, Imdur, Lipitor, ASA, and Plavix.   In February 2021 she underwent appendectomy for acute appendicitis. She has recovered well from that. She denies any recurrent chest pain, SOB, palpitations, dizziness. She is active and works a lot. She did develop a large bruise on her calf but this has  resolved.   The patient does not have symptoms concerning for COVID-19 infection (fever, chills, cough, or new shortness of breath).    Past Medical History:  Diagnosis Date  . Chest pain 07/17/2019  . Eczema, allergic   . Hypertension   . NSTEMI (non-ST elevated myocardial infarction) (HCC)   . Psoriasis   . Tobacco use    Past Surgical History:  Procedure Laterality Date  . LAPAROSCOPIC APPENDECTOMY N/A 08/16/2019   Procedure: ATTEMPTED LAPAROSCOPIC APPENDECTOMY Berna Bue;  Surgeon: Griselda Miner, MD;  Location: [facility];  Service: General;  Laterality: N/A;  . LEFT HEART CATH AND CORONARY ANGIOGRAPHY N/A 07/18/2019   Procedure: LEFT HEART CATH AND CORONARY ANGIOGRAPHY;  Surgeon: Lyn Records, MD;  Location: [facility];  Service: Cardiovascular;  Laterality: N/A;  . NO PAST SURGERIES       Current Meds  Medication Sig  . aspirin EC 81 MG tablet Take 1 tablet (81 mg total) by mouth daily.  Marland Kitchen atorvastatin (LIPITOR) 40 MG tablet Take 1 tablet (40 mg total) by mouth daily at 12 noon.  . diltiazem (CARDIZEM CD) 120 MG 24 hr capsule Take 1 capsule (120 mg total) by mouth daily.  . isosorbide mononitrate (IMDUR) 30 MG 24 hr tablet Take 1 tablet (30 mg total) by mouth daily.  . nitroGLYCERIN (NITROSTAT) 0.4 MG SL tablet Place 1 tablet (0.4 mg total) under the tongue every 5 (five) minutes x 3 doses as needed for chest pain.  . [DISCONTINUED] clopidogrel (PLAVIX) 75 MG tablet  Take 1 tablet (75 mg total) by mouth daily.     Allergies:   Patient has no known allergies.   Social History   Tobacco Use  . Smoking status: Former Smoker    Packs/day: 0.50    Types: Cigarettes    Quit date: 07/29/2019    Years since quitting: 0.2  . Smokeless tobacco: Never Used  Substance Use Topics  . Alcohol use: Yes    Comment: 3 times a week; last drink last night   . Drug use: Yes    Types: Marijuana    Comment: Daily      Family Hx: The patient's family history includes  Arrhythmia in her paternal grandfather; Diabetes in an other family member; Healthy in her father and mother; Heart disease in her paternal grandfather; Hypertension in an other family member.  ROS:   Please see the history of present illness.    All other systems reviewed and are negative.   Prior CV studies:   The following studies were reviewed today:  The following studies were reviewed today: Cath 07/18/2019:  LEFT HEART CATH AND CORONARY ANGIOGRAPHY  Conclusion   Normal left main  Normal LAD diagonals.  Normal circumflex  Right coronary, with relatively small distal territory.  Proximal RCA ectasia.  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  Normal LV function.  RECOMMENDATIONS:   Clinical diagnosis is MINOCA vs CA spasm vs mild pericarditis.    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  Consider MRI, cardiac.    Echo 07/17/2019: IMPRESSIONS    1. Left ventricular ejection fraction, by visual estimation, is 60 to  65%. The left ventricle has normal function. There is no left ventricular  hypertrophy.  2. The left ventricle has no regional wall motion abnormalities.  3. Global right ventricle has normal systolic function.The right  ventricular size is normal. No increase in right ventricular wall  thickness.  4. Left atrial size was normal.  5. Right atrial size was normal.  6. The mitral valve is normal in structure. Trivial mitral valve  regurgitation. No evidence of mitral stenosis.  7. The tricuspid valve is normal in structure.  8. The tricuspid valve is normal in structure. Tricuspid valve  regurgitation is not demonstrated.  9. The aortic valve is normal in structure. Aortic valve regurgitation is  not visualized. No evidence of aortic valve sclerosis or stenosis.  10. The pulmonic valve was grossly normal. Pulmonic valve regurgitation is  not  visualized.  11. The inferior vena cava is normal in size with greater than 50%  respiratory variability, suggesting right atrial pressure of 3 mmHg.     Labs/Other Tests and Data Reviewed:    EKG:  No ECG reviewed.  Recent Labs: 07/17/2019: TSH 1.716 08/13/2019: ALT 19 08/15/2019: Magnesium 2.0 08/17/2019: BUN 7; Creatinine, Ser 0.53; Potassium 4.0; Sodium 135 08/18/2019: Hemoglobin 10.6; Platelets 167   Recent Lipid Panel Lab Results  Component Value Date/Time   CHOL 148 07/18/2019 02:53 AM   TRIG 39 07/18/2019 02:53 AM   HDL 78 07/18/2019 02:53 AM   CHOLHDL 1.9 07/18/2019 02:53 AM   LDLCALC 62 07/18/2019 02:53 AM    Wt Readings from Last 3 Encounters:  11/01/19 108 lb (49 kg)  08/13/19 108 lb (49 kg)  07/23/19 113 lb (51.3 kg)     Objective:    Vital Signs:  Ht 5\' 6"  (1.676 m)  Wt 108 lb (49 kg)   BMI 17.43 kg/m    VITAL SIGNS:  reviewed  ASSESSMENT & PLAN:    1. S/p NSTEMI with normal coronary artery findings on cath. Suspect vasospastic in nature. Now on nitrates and diltiazem. Asymptomatic now. Will discontinue Plavix due to spontaneous bruising. Continue other therapy. Follow up in 6 months.  2. HTN controlled. 3. Dyslipidemia. On statin. Repeat lab on follow up in 6 months.  COVID-19 Education: The signs and symptoms of COVID-19 were discussed with the patient and how to seek care for testing (follow up with PCP or arrange E-visit).  The importance of social distancing was discussed today.  Time:   Today, I have spent 10 minutes with the patient with telehealth technology discussing the above problems.     Medication Adjustments/Labs and Tests Ordered: Current medicines are reviewed at length with the patient today.  Concerns regarding medicines are outlined above.   Tests Ordered: No orders of the defined types were placed in this encounter.   Medication Changes: No orders of the defined types were placed in this encounter.   Follow Up:  In  Person in 6 months with lab  Signed, Alarik Radu [place], MD  11/01/2019 2:00 PM    [place]

## 2019-11-01 ENCOUNTER — Encounter: Payer: Self-pay | Admitting: Cardiology

## 2019-11-01 ENCOUNTER — Telehealth (INDEPENDENT_AMBULATORY_CARE_PROVIDER_SITE_OTHER): Payer: Self-pay | Admitting: Cardiology

## 2019-11-01 VITALS — Ht 66.0 in | Wt 108.0 lb

## 2019-11-01 DIAGNOSIS — I2510 Atherosclerotic heart disease of native coronary artery without angina pectoris: Secondary | ICD-10-CM

## 2019-11-01 DIAGNOSIS — I214 Non-ST elevation (NSTEMI) myocardial infarction: Secondary | ICD-10-CM

## 2019-11-01 DIAGNOSIS — Z87891 Personal history of nicotine dependence: Secondary | ICD-10-CM

## 2019-11-01 DIAGNOSIS — E785 Hyperlipidemia, unspecified: Secondary | ICD-10-CM

## 2019-11-01 DIAGNOSIS — E7800 Pure hypercholesterolemia, unspecified: Secondary | ICD-10-CM

## 2019-11-01 DIAGNOSIS — I10 Essential (primary) hypertension: Secondary | ICD-10-CM

## 2019-11-01 NOTE — Patient Instructions (Signed)
Medication Instructions:  Stop Plavix Continue all other medications *If you need a refill on your cardiac medications before your next appointment, please call your pharmacy*   Lab Work: Fasting lab bmet,lipid and hepatic panels in 6 months Lab orders enclosed    Testing/Procedures: None ordered   Follow-Up: At [facility], you and your health needs are our priority.  As part of our continuing mission to provide you with exceptional heart care, we have created designated Provider Care Teams.  These Care Teams include your primary Cardiologist (physician) and Advanced Practice Providers (APPs -  Physician Assistants and Nurse Practitioners) who all work together to provide you with the care you need, when you need it.  We recommend signing up for the patient portal called "MyChart".  Sign up information is provided on this After Visit Summary.  MyChart is used to connect with patients for Virtual Visits (Telemedicine).  Patients are able to view lab/test results, encounter notes, upcoming appointments, etc.  Non-urgent messages can be sent to your provider as well.   To learn more about what you can do with MyChart, go to [email or web address].    Your next appointment:  6 months   Call in July to schedule Nov appointment    The format for your next appointment: Office    Provider: Dr.Jordan

## 2019-11-01 NOTE — Addendum Note (Signed)
Addended by: Neoma Laming on: 11/01/2019 02:12 PM   Modules accepted: Orders

## 2019-12-02 ENCOUNTER — Ambulatory Visit: Payer: Self-pay | Admitting: *Deleted

## 2019-12-02 NOTE — Telephone Encounter (Signed)
Summary: [facility]  Cramping   Pt (Janice Esparza) needs call back [phone], had appendix taken out in Feb and now she is experiencing really bad cramping now since Saturday which is not normal for her before surgury and wanting advice/ no PCP/ [employer]      Patient had cycle Saturday and reports she is having cramping since it started. Patient reports cramping normal day1-2. Patient is not getting relief.  Reason for Disposition  [1] Pain present > 3 days AND [2] normally menstrual cramps last 1-3 days  Answer Assessment - Initial Assessment Questions 1. LOCATION: "Where does it hurt?"      Lower abdomen- all across 2. ONSET: "When did this episode of pain begin?"       Saturday 3. SEVERITY: "How bad is the pain?" "Are you missing school or work because of the pain?"  (e.g., Scale 1-10; mild, moderate, or severe)   - MILD (1-3): doesn't interfere with normal activities, lasting 1-2 days    - MODERATE (4-7): interferes with normal activities (missing work or school), lasting 2-3 days, some associated GI symptoms    - SEVERE (8-10): excruciating pain, lasting 2-7 days, associated GI symptoms, pain radiating into thighs and back     moderate 4. VAGINAL BLEEDING: "Describe the bleeding that you are having." "How much bleeding is there?"    - SPOTTING: spotting, or pinkish / brownish mucous discharge; does not fill panti-liner or pad    - MILD:  less than 1 pad / hour; less than patient's usual menstrual bleeding   - MODERATE: 1-2 pads / hour; small-medium blood clots (e.g., pea, grape, small coin)    - SEVERE: soaking 2 or more pads/hour for 2 or more hours; bleeding not contained by pads or continuous red blood from vagina; large blood clots (e.g., golf ball, large coin)      Mild/moderate 5. MENSTRUAL HISTORY:  "When did this menstrual period begin?", "Is this a normal period for you?"       Saturday- normal cycle except pain 6. LMP:  "When did your last menstrual period  begin?"     Saturday 7. OTHER SYMPTOMS: "What other symptoms are you having with the pain?" (e.g., fever, dizzy/lighthead, vomiting, diarrhea, vaginal discharge)     no 8. PREGNANCY: "Is there any chance you are pregnant?" (e.g., unprotected intercourse, missed birth control pill, broken condom)     Started 4 days early- no birth control  Protocols used: ABDOMINAL PAIN - MENSTRUAL CRAMPS-A-AH

## 2019-12-15 IMAGING — CT CT NECK WITH CONTRAST
3 of 5 series · 11 of 33 positions shown, 13 images · IV contrast (omnipaque)
Comparison: CT neck 09/21/2018.

CLINICAL DATA: Recent peritonsillar abscess. Continued symptoms.

EXAM:
CT NECK WITH CONTRAST
TECHNIQUE: Multidetector CT imaging of the neck was performed using the
standard protocol following the bolus administration of intravenous
contrast.
CONTRAST:  75mL OMNIPAQUE IOHEXOL 300 MG/ML  SOLN

[Series 6: neck 2.0 st · sagittal · 0.40mm/px · 5 of 88 slices shown, 6 images (1 of 2)]
[im 30/88  bone]
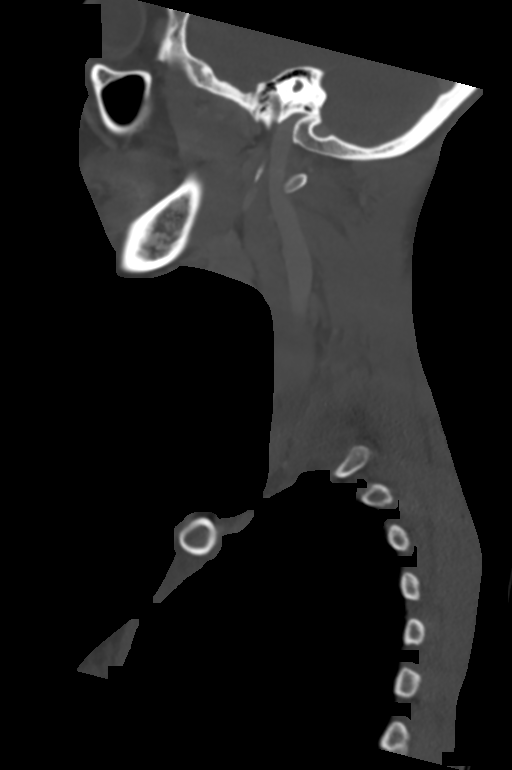
[im 37/88  bone]
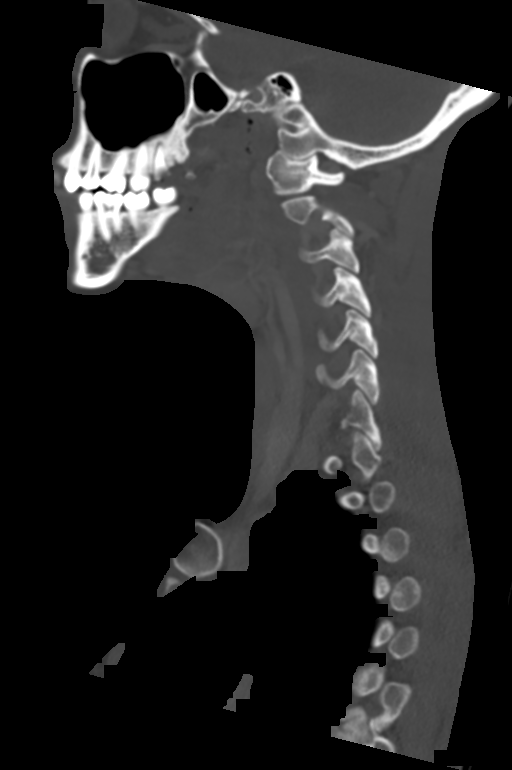
[im 44/88  soft-tissue]
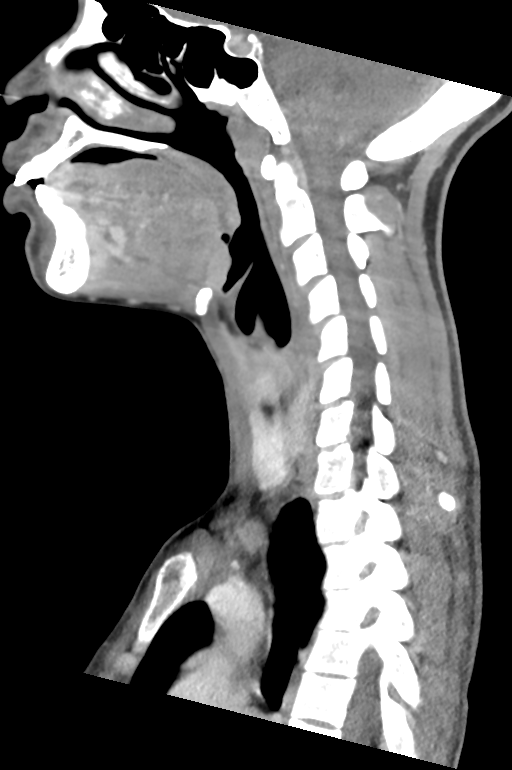
[im 44/88  bone]
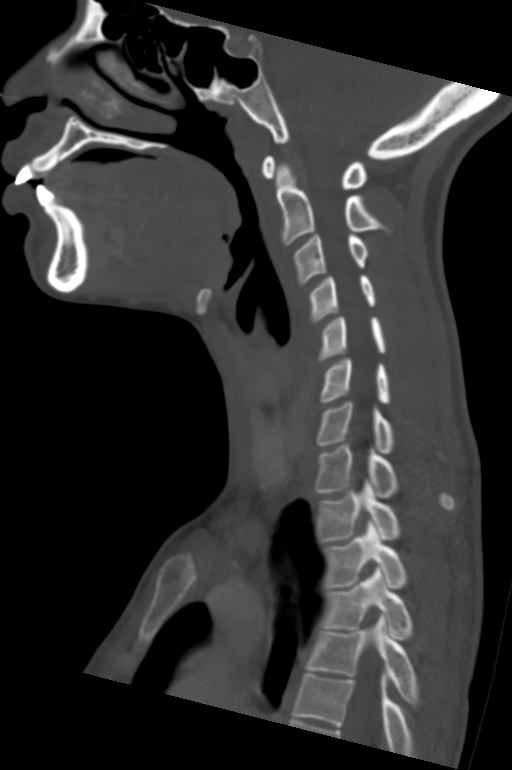
[im 51/88  bone]
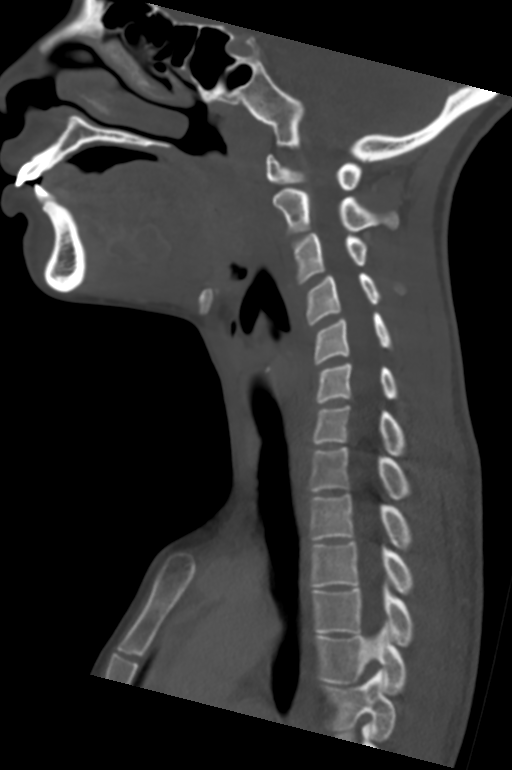
[im 59/88  bone]
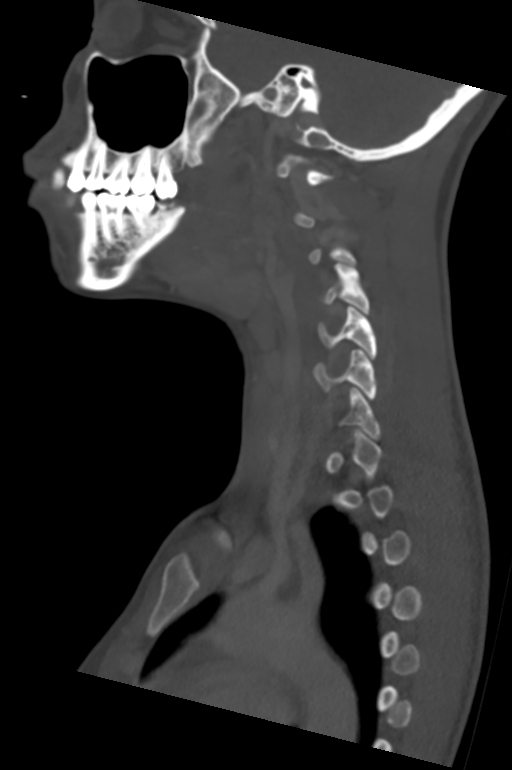

[Series 7: neck 2.0 st · coronal · 0.34mm/px · 3 of 103 slices shown (2 of 2)]
[im 22/103  bone]
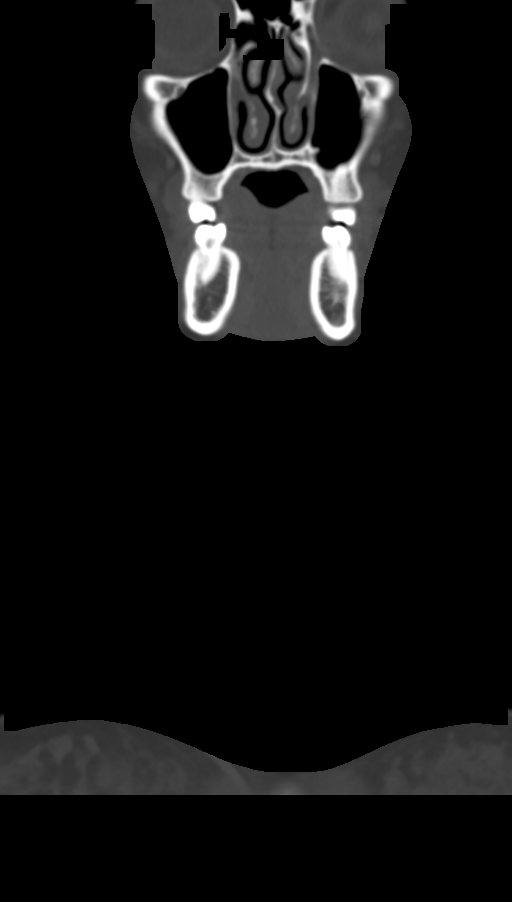
[im 42/103  bone]
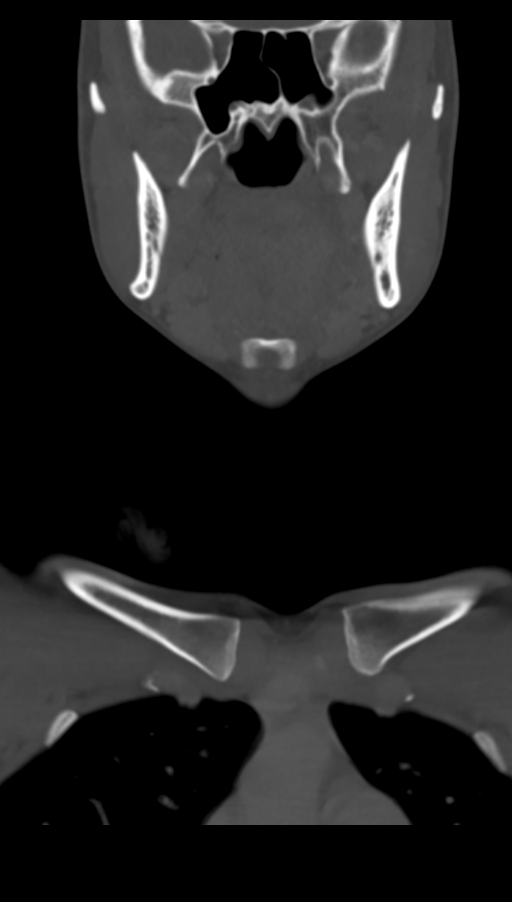
[im 61/103  bone]
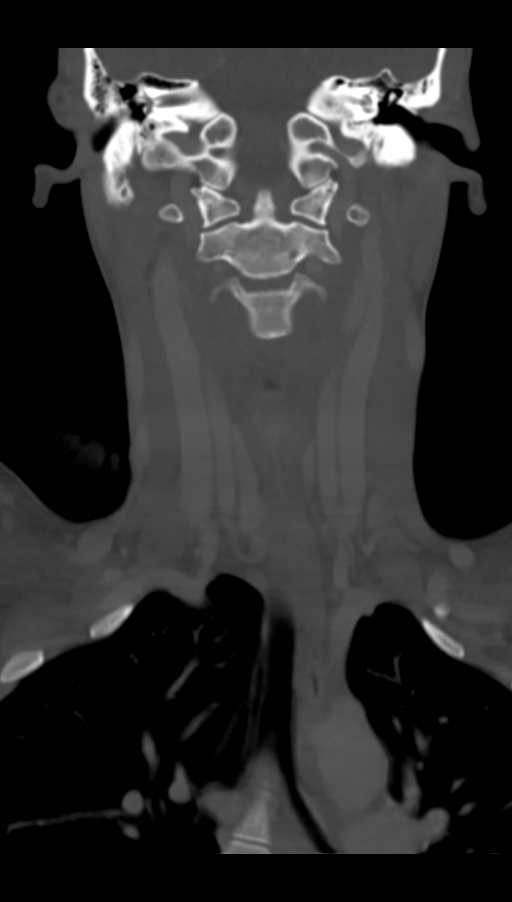

[Series 8: neck 2.0 st orthogonal · axial · 0.34mm/px · z∈[-427,-247]mm · 3 of 156 slices shown, 4 images]
[im 32/156  soft-tissue]
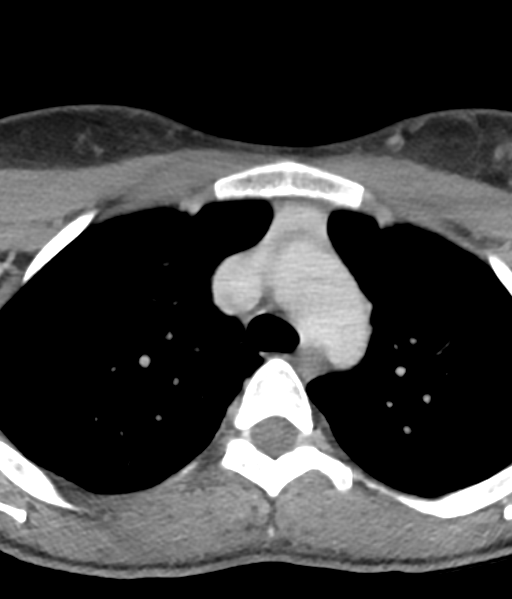
[im 32/156  bone]
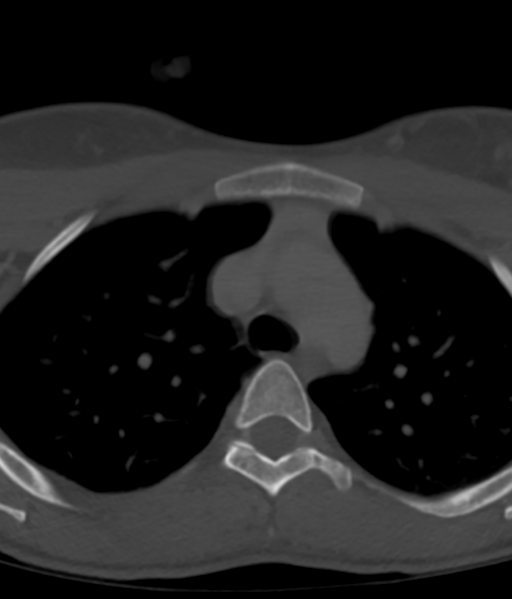
[im 94/156  bone]
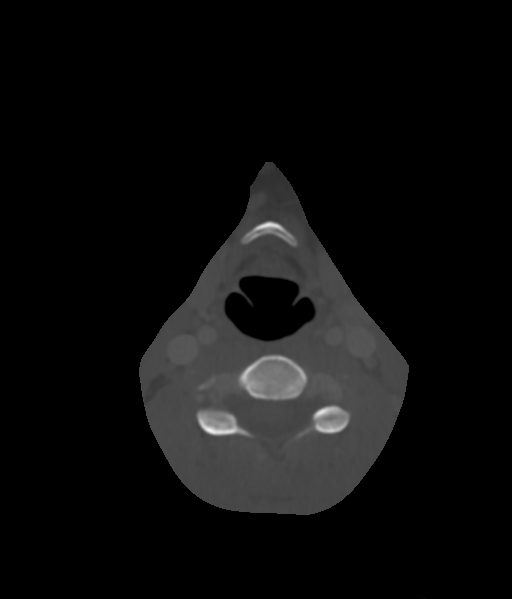
[im 125/156  bone]
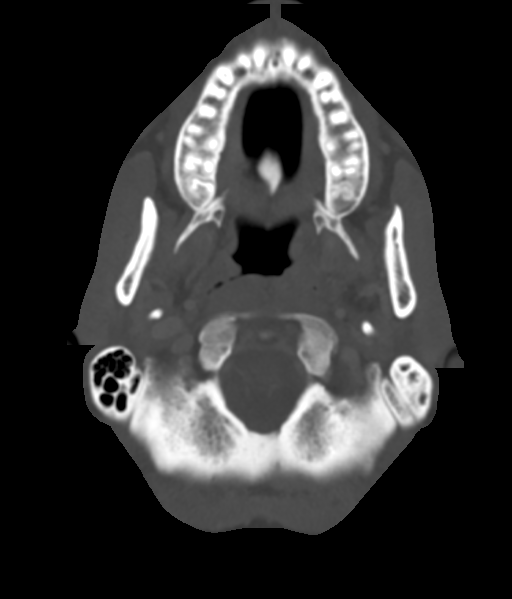

[11 of 33 positions shown; findings below may reference images not displayed]

FINDINGS: Pharynx and larynx: Continued LEFT tonsillar and peritonsillar
inflammation, but with less mass effect compared with priors.
Overall phlegmon measures approximately 1.5 x 2.5 cm on image 36.
There is a small LEFT tonsillar microabscess measuring 4 x 4 mm and
a small LEFT peritonsillar microabscess measuring 4 x 7 mm. No
similar inflammation on the RIGHT. Normal larynx. Airway patent.

Salivary glands: No inflammation, mass, or stone.

Thyroid: Normal.

Lymph nodes: Regional lymphadenopathy on the LEFT is reactive, but
improved from priors. Up to 12 mm short axis LEFT level II lymph
node.

Vascular: Negative.

Limited intracranial: Negative.

Visualized orbits: Negative.

Mastoids and visualized paranasal sinuses: Clear.

Skeleton: No acute or aggressive process.

Upper chest: Negative.

Other: None.
IMPRESSION: Continued LEFT tonsillar and peritonsillar inflammation, but overall
improved, with less mass effect compared with 09/21/2018, most
consistent with phlegmon; approximate cross-section 1.5 x 2.5 cm.

Small LEFT tonsillar microabscess and 4 x 7 mm LEFT peritonsillar
microabscess. Reactive LEFT-sided lymphadenopathy.

## 2020-04-13 ENCOUNTER — Telehealth: Payer: Self-pay | Admitting: Cardiology

## 2020-04-13 MED ORDER — ISOSORBIDE MONONITRATE ER 30 MG PO TB24: 30.0000 mg | ORAL_TABLET | Freq: Every day | ORAL | 3 refills | Status: AC

## 2020-04-13 MED ORDER — DILTIAZEM HCL ER COATED BEADS 120 MG PO CP24: 120.0000 mg | ORAL_CAPSULE | Freq: Every day | ORAL | 3 refills | Status: AC

## 2020-04-13 MED ORDER — ATORVASTATIN CALCIUM 40 MG PO TABS: 40.0000 mg | ORAL_TABLET | Freq: Every day | ORAL | 3 refills | Status: AC

## 2020-04-13 MED ORDER — ASPIRIN EC 81 MG PO TBEC: 81.0000 mg | DELAYED_RELEASE_TABLET | Freq: Every day | ORAL | 3 refills | Status: AC

## 2020-04-13 MED ORDER — NITROGLYCERIN 0.4 MG SL SUBL: 0.4000 mg | SUBLINGUAL_TABLET | SUBLINGUAL | 11 refills | Status: AC | PRN

## 2020-04-13 NOTE — Telephone Encounter (Signed)
Spoke to patient she stated she stopped taking all of her medications.Stated she does not know why she stopped taking.Advised she needs to take all medications as prescribed.Refills sent to her pharmacy.Follow up appointment scheduled with Dr.Jordan 04/30/20 at 11:20 am.

## 2020-04-13 NOTE — Telephone Encounter (Signed)
Pt c/o medication issue:  1. Name of Medication: atorvastatin (LIPITOR) 40 MG tablet diltiazem (CARDIZEM CD) 120 MG 24 hr capsule isosorbide mononitrate (IMDUR) 30 MG 24 hr tablet aspirin EC 81 MG tablet 2. How are you currently taking this medication (dosage and times per day)? Has not taken since May when she last spoke with Dr. [place]   3. Are you having a reaction (difficulty breathing--STAT)? No   4. What is your medication issue? Janice Esparza is calling stating she was recently denied being accepted into a rehab facility due to her no longer taking her heart medications. She is wanting to know if Dr. [place] feels she should begin taking them again. Please advise.

## 2020-04-28 NOTE — Progress Notes (Deleted)
Cardiology Office Note   Date:  04/28/2020   ID:  Janice Esparza, DOB 02-18-1991, MRN [id number]  PCP:  Patient, No Pcp Per  Cardiologist:   Janice Purdy [place], MD   No chief complaint on file.     History of Present Illness: Janice Esparza is a 29 y.o. female who presents for follow up vasospastic angina. She has a with no hx of CAD or HTN, presented to [facility] 07/17/2019 with SSCP "pressure".  Troponin was elevated- peak 1444.  She had a normal echo.  The next day she had recurrent chest pain and NST changes on her EKG.  Cath showed no significant CAD.  Cardiac MRI was negative for scar or inflammation. Drug screen was negative for cocaine, positive for THC.  She was treated for possible vasospastic disease with  Diltiazem, Imdur, Lipitor, ASA, and Plavix.   In February 2021 she underwent appendectomy for acute appendicitis. She has recovered well from that. She denies any recurrent chest pain, SOB, palpitations, dizziness. She is active and works a lot. She did develop a large bruise on her calf but this has resolved.     Past Medical History:  Diagnosis Date  . Chest pain 07/17/2019  . Eczema, allergic   . Hypertension   . NSTEMI (non-ST elevated myocardial infarction) (HCC)   . Psoriasis   . Tobacco use     Past Surgical History:  Procedure Laterality Date  . LAPAROSCOPIC APPENDECTOMY N/A 08/16/2019   Procedure: ATTEMPTED LAPAROSCOPIC APPENDECTOMY Janice Esparza;  Surgeon: Griselda Miner, MD;  Location: [facility];  Service: General;  Laterality: N/A;  . LEFT HEART CATH AND CORONARY ANGIOGRAPHY N/A 07/18/2019   Procedure: LEFT HEART CATH AND CORONARY ANGIOGRAPHY;  Surgeon: Lyn Records, MD;  Location: [facility] INV[facility]IVE CV LAB;  Service: Cardiovascular;  Laterality: N/A;  . NO PAST SURGERIES       Current Outpatient Medications  Medication Sig Dispense Refill  . aspirin EC 81 MG tablet Take 1 tablet (81 mg total) by mouth daily. 90 tablet 3  . atorvastatin (LIPITOR) 40 MG tablet  Take 1 tablet (40 mg total) by mouth daily at 12 noon. 90 tablet 3  . diltiazem (CARDIZEM CD) 120 MG 24 hr capsule Take 1 capsule (120 mg total) by mouth daily. 90 capsule 3  . isosorbide mononitrate (IMDUR) 30 MG 24 hr tablet Take 1 tablet (30 mg total) by mouth daily. 90 tablet 3  . nitroGLYCERIN (NITROSTAT) 0.4 MG SL tablet Place 1 tablet (0.4 mg total) under the tongue every 5 (five) minutes x 3 doses as needed for chest pain. 25 tablet 11   No current facility-administered medications for this visit.    Allergies:   Patient has no known allergies.    Social History:  The patient  reports that she quit smoking about 9 months ago. Her smoking use included cigarettes. She smoked 0.50 packs per day. She has never used smokeless tobacco. She reports current alcohol use. She reports current drug use. Drug: Marijuana.   Family History:  The patient's ***family history includes Arrhythmia in her paternal grandfather; Diabetes in an other family member; Healthy in her father and mother; Heart disease in her paternal grandfather; Hypertension in an other family member.    ROS:  Please see the history of present illness.   Otherwise, review of systems are positive for {NONE DEFAULTED:18576::"none"}.   All other systems are reviewed and negative.    PHYSICAL EXAM: VS:  There were no vitals  taken for this visit. , BMI There is no height or weight on file to calculate BMI. GEN: Well nourished, well developed, in no acute distress  HEENT: normal  Neck: no JVD, carotid bruits, or masses Cardiac: ***RRR; no murmurs, rubs, or gallops,no edema  Respiratory:  clear to auscultation bilaterally, normal work of breathing GI: soft, nontender, nondistended, + BS MS: no deformity or atrophy  Skin: warm and dry, no rash Neuro:  Strength and sensation are intact Psych: euthymic mood, full affect   EKG:  EKG {ACTION; IS/IS QIW:97989211} ordered today. The ekg ordered today demonstrates ***   Recent  Labs: 07/17/2019: TSH 1.716 08/13/2019: ALT 19 08/15/2019: Magnesium 2.0 08/17/2019: BUN 7; Creatinine, Ser 0.53; Potassium 4.0; Sodium 135 08/18/2019: Hemoglobin 10.6; Platelets 167    Lipid Panel    Component Value Date/Time   CHOL 148 07/18/2019 0253   TRIG 39 07/18/2019 0253   HDL 78 07/18/2019 0253   CHOLHDL 1.9 07/18/2019 0253   VLDL 8 07/18/2019 0253   LDLCALC 62 07/18/2019 0253      Wt Readings from Last 3 Encounters:  11/01/19 108 lb (49 kg)  08/13/19 108 lb (49 kg)  07/23/19 113 lb (51.3 kg)      Other studies Reviewed: Additional studies/ records that were reviewed today include:   Cath 07/18/2019:  LEFT HEART CATH AND CORONARY ANGIOGRAPHY  Conclusion   Normal left main  Normal LAD diagonals.  Normal circumflex  Right coronary, with relatively small distal territory. Proximal RCA ectasia. 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  Normal LV function.  RECOMMENDATIONS:   Clinical diagnosis is MINOCA vs CA spasm vs mild pericarditis.   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  Consider MRI, cardiac.    Echo 07/17/2019: IMPRESSIONS    1. Left ventricular ejection fraction, by visual estimation, is 60 to  65%. The left ventricle has normal function. There is no left ventricular  hypertrophy.  2. The left ventricle has no regional wall motion abnormalities.  3. Global right ventricle has normal systolic function.The right  ventricular size is normal. No increase in right ventricular wall  thickness.  4. Left atrial size was normal.  5. Right atrial size was normal.  6. The mitral valve is normal in structure. Trivial mitral valve  regurgitation. No evidence of mitral stenosis.  7. The tricuspid valve is normal in structure.  8. The tricuspid valve is normal in structure. Tricuspid valve  regurgitation is not demonstrated.  9. The aortic valve  is normal in structure. Aortic valve regurgitation is  not visualized. No evidence of aortic valve sclerosis or stenosis.  10. The pulmonic valve was grossly normal. Pulmonic valve regurgitation is  not visualized.  11. The inferior vena cava is normal in size with greater than 50%  respiratory variability, suggesting right atrial pressure of 3 mmHg.      ASSESSMENT AND PLAN:  1. S/p NSTEMI with normal coronary artery findings on cath. Suspect vasospastic in nature. Now on nitrates and diltiazem. Asymptomatic now. Will discontinue Plavix due to spontaneous bruising. Continue other therapy. Follow up in 6 months.  2. HTN controlled.   3. Dyslipidemia. On statin. Repeat lab on follow up in 6 months.   Current medicines are reviewed at length with the patient today.  The patient {ACTIONS; HAS/DOES NOT HAVE:19233} concerns regarding medicines.  The following changes have been made:  {PLAN; NO CHANGE:13088:s}  Labs/ tests ordered today include: *** No orders of the defined types were placed in this encounter.    Disposition:   FU with *** in {gen number 1-85:[id number]} {Days to years:10300}  Signed, Janki Dike [place], MD  04/28/2020 1:21 PM    [facility] [address], [place], [place], [address] Phone [phone], Fax [phone]

## 2020-04-30 ENCOUNTER — Ambulatory Visit: Payer: Self-pay | Admitting: Cardiology

## 2020-05-01 ENCOUNTER — Encounter (HOSPITAL_COMMUNITY): Payer: Self-pay | Admitting: Emergency Medicine

## 2020-05-01 ENCOUNTER — Other Ambulatory Visit: Payer: Self-pay

## 2020-05-01 ENCOUNTER — Emergency Department (HOSPITAL_COMMUNITY)
Admission: EM | Admit: 2020-05-01 | Discharge: 2020-05-01 | Disposition: A | Discharge status: Home / Self Care | Payer: Self-pay | Attending: Emergency Medicine | Admitting: Emergency Medicine

## 2020-05-01 ENCOUNTER — Telehealth: Payer: Self-pay | Admitting: *Deleted

## 2020-05-01 ENCOUNTER — Ambulatory Visit: Payer: Self-pay

## 2020-05-01 DIAGNOSIS — Z5321 Procedure and treatment not carried out due to patient leaving prior to being seen by health care provider: Secondary | ICD-10-CM | POA: Insufficient documentation

## 2020-05-01 DIAGNOSIS — R1031 Right lower quadrant pain: Secondary | ICD-10-CM | POA: Insufficient documentation

## 2020-05-01 LAB — URINALYSIS, ROUTINE W REFLEX MICROSCOPIC
Bilirubin Urine: NEGATIVE
Glucose, UA: NEGATIVE mg/dL
Hgb urine dipstick: NEGATIVE
Ketones, ur: NEGATIVE mg/dL
Leukocytes,Ua: NEGATIVE
Nitrite: NEGATIVE
Protein, ur: NEGATIVE mg/dL
Specific Gravity, Urine: 1.018 (ref 1.005–1.030)
pH: 6 (ref 5.0–8.0)

## 2020-05-01 LAB — COMPREHENSIVE METABOLIC PANEL
ALT: 21 U/L (ref 0–44)
AST: 31 U/L (ref 15–41)
Albumin: 5 g/dL (ref 3.5–5.0)
Alkaline Phosphatase: 73 U/L (ref 38–126)
Anion gap: 10 (ref 5–15)
BUN: 12 mg/dL (ref 6–20)
CO2: 24 mmol/L (ref 22–32)
Calcium: 9.8 mg/dL (ref 8.9–10.3)
Chloride: 100 mmol/L (ref 98–111)
Creatinine, Ser: 0.59 mg/dL (ref 0.44–1.00)
GFR, Estimated: >60 mL/min (ref >60)
Glucose, Bld: 95 mg/dL (ref 70–99)
Potassium: 3.5 mmol/L (ref 3.5–5.1)
Sodium: 134 mmol/L — ABNORMAL LOW (ref 135–145)
Total Bilirubin: 0.7 mg/dL (ref 0.3–1.2)
Total Protein: 9.6 g/dL — ABNORMAL HIGH (ref 6.5–8.1)

## 2020-05-01 LAB — CBC
HCT: 40.3 % (ref 36.0–46.0)
Hemoglobin: 13.5 g/dL (ref 12.0–15.0)
MCH: 30.8 pg (ref 26.0–34.0)
MCHC: 33.5 g/dL (ref 30.0–36.0)
MCV: 92 fL (ref 80.0–100.0)
Platelets: 217 10*3/uL (ref 150–400)
RBC: 4.38 MIL/uL (ref 3.87–5.11)
RDW: 13.2 % (ref 11.5–15.5)
WBC: 3 10*3/uL — ABNORMAL LOW (ref 4.0–10.5)
nRBC: 0 % (ref 0.0–0.2)

## 2020-05-01 LAB — LIPASE, BLOOD: Lipase: 26 U/L (ref 11–51)

## 2020-05-01 LAB — I-STAT BETA HCG BLOOD, ED (MC, WL, AP ONLY): I-stat hCG, quantitative: <5 m[IU]/mL (ref <5)

## 2020-05-01 NOTE — Telephone Encounter (Signed)
See Triage note dated 05/01/20.

## 2020-05-01 NOTE — ED Notes (Signed)
Pt called back for vital sign reassessment and no response.

## 2020-05-01 NOTE — Telephone Encounter (Signed)
Patient called says she was at [facility] ED ,she became restless and left .She says that test results were in her my chart account and would like a call back with further information.please call her at [phone].

## 2020-05-01 NOTE — Telephone Encounter (Signed)
Phone call to pt. In response to request for a nurse, to call her, to discuss her ER visit. Reported she was in [facility] ER today, and got restless and left without getting lab results or any explanation of what's causing her pain.  Reported onset of abdominal pain about 3 weeks ago, just above the navel area.  Stated the pain hadn't been that bad until today.  Rated at 10/10 when she went to the ER.  Now stated that the pain is 6/10.  Stated she had her appendix removed in February.  Stated the pain is just above the appendix incision.  Denied nausea, vomiting, fever, or chills.  Stated the area just above the navel is "tender to press on it".  Stated her bowels are working well; denied constipation or diarrhea.  Advised due to the level of her pain, she would need to return to the ER for complete evaluation.  Questioned about what the lab results showed?  Informed pt. that I am not working in the ER, and cannot give those results to her, without a doctor's review, and okay to release to patient.  Pt. stated "why did you even call me, if you're not at [facility] ER, then?  I want to speak to a nurse in the ER."  Pt. Abruptly ended the call.      Reason for Disposition  [1] MILD-MODERATE pain AND [2] constant AND [3] present > 2 hours  Answer Assessment - Initial Assessment Questions 1. LOCATION: "Where does it hurt?"      Just above the navel 2. RADIATION: "Does the pain shoot anywhere else?" (e.g., chest, back)    Denied radiation 3. ONSET: "When did the pain begin?" (e.g., minutes, hours or days ago)      About 3 weeks ago 4. SUDDEN: "Gradual or sudden onset?"     sudden 5. PATTERN "Does the pain come and go, or is it constant?"    - If constant: "Is it getting better, staying the same, or worsening?"      (Note: Constant means the pain never goes away completely; most serious pain is constant and it progresses)     - If intermittent: "How long does it last?" "Do you have pain now?"      (Note: Intermittent means the pain goes away completely between bouts)     Comes and goes  6. SEVERITY: "How bad is the pain?"  (e.g., Scale 1-10; mild, moderate, or severe)   - MILD (1-3): doesn't interfere with normal activities, abdomen soft and not tender to touch    - MODERATE (4-7): interferes with normal activities or awakens from sleep, tender to touch    - SEVERE (8-10): excruciating pain, doubled over, unable to do any normal activities      6/10 7. RECURRENT SYMPTOM: "Have you ever had this type of stomach pain before?" If Yes, ask: "When was the last time?" and "What happened that time?"      no 8. CAUSE: "What do you think is causing the stomach pain?"     Not sure; questions if it has anything  9. RELIEVING/AGGRAVATING FACTORS: "What makes it better or worse?" (e.g., movement, antacids, bowel movement)     Tylenol helps 10. OTHER SYMPTOMS: "Has there been any vomiting, diarrhea, constipation, or urine problems?"       Denied nausea/ vomiting, or fever / chills.  Bowels moving okay. C/o tenderness when pressing on it.     11. PREGNANCY: "Is there any  chance you are pregnant?" "When was your last menstrual period?"       LMP 10/16  Protocols used: ABDOMINAL PAIN - [facility]

## 2020-05-01 NOTE — ED Notes (Signed)
Pt called a third time for vitals; no response.

## 2020-05-01 NOTE — ED Notes (Signed)
Pt called back again for vitals; no response.

## 2020-05-01 NOTE — ED Triage Notes (Signed)
Hap appendix removed in February, x2-3 weeks has had intermittent RLQ abd pain in same area where appendix was removed. Denies N/V/D or constipation. LMP mid-October. Denies issues w/ urination, no back pain.

## 2020-05-11 ENCOUNTER — Encounter: Payer: Self-pay | Admitting: Physician Assistant

## 2020-05-11 ENCOUNTER — Telehealth (INDEPENDENT_AMBULATORY_CARE_PROVIDER_SITE_OTHER): Payer: Self-pay | Admitting: Physician Assistant

## 2020-05-11 VITALS — Ht 66.0 in | Wt 115.0 lb

## 2020-05-11 DIAGNOSIS — I201 Angina pectoris with documented spasm: Secondary | ICD-10-CM

## 2020-05-11 NOTE — Progress Notes (Signed)
Virtual Visit via Telephone Note   This visit type was conducted due to national recommendations for restrictions regarding the COVID-19 Pandemic (e.g. social distancing) in an effort to limit this patient's exposure and mitigate transmission in our community.  Due to her co-morbid illnesses, this patient is at least at moderate risk for complications without adequate follow up.  This format is felt to be most appropriate for this patient at this time.  The patient did not have access to video technology/had technical difficulties with video requiring transitioning to audio format only (telephone).  All issues noted in this document were discussed and addressed.  No physical exam could be performed with this format.  Please refer to the patient's chart for her  consent to telehealth for [facility].    Date:  05/11/2020   ID:  Janice Esparza, DOB [birth year], MRN [id number] The patient was identified using 2 identifiers.  Patient Location: Home Provider Location: Office/Clinic  PCP:  Patient, No Pcp Per  Cardiologist:  Peter Swaziland, MD  Electrophysiologist:  None   Evaluation Performed:  Follow-Up Visit  Chief Complaint:  Follow up  History of Present Illness:    Janice Esparza is a 29 y.o. female with history of NSTEMI, psoriasis and tobacco use. Patient was admitted to the hospital with NSTEMI in January 2021 with troponin peaked at 1444. Echocardiogram showed normal EF. Cardiac catheterization showed no CAD. Cardiac MRI was negative for scar or inflammation. Drug screen was negative for cocaine but positive for THC. She was treated for possible vasospastic disease with Imdur, diltiazem, aspirin, Plavix and Lipitor. In February 2021, she underwent appendectomy for acute appendicitis. Patient was last seen virtually by Dr. Swaziland in May 2021 at which time she was doing well, her Plavix was discontinued.  Patient presents today for virtual follow-up.  She denies any recent chest pain  or shortness of breath.  She recently went to the [facility] ED for abdominal pain.  Abdominal pain has resolved and has not recurred since.  Overall, she is doing well from cardiac perspective and can follow-up in 6 months.  The patient does not have symptoms concerning for COVID-19 infection (fever, chills, cough, or new shortness of breath).    Past Medical History:  Diagnosis Date  . Chest pain 07/17/2019  . Eczema, allergic   . Hypertension   . NSTEMI (non-ST elevated myocardial infarction) (HCC)   . Psoriasis   . Tobacco use    Past Surgical History:  Procedure Laterality Date  . APPENDECTOMY    . LAPAROSCOPIC APPENDECTOMY N/A 08/16/2019   Procedure: ATTEMPTED LAPAROSCOPIC APPENDECTOMY Berna Bue;  Surgeon: Griselda Miner, MD;  Location: [facility] ORS;  Service: General;  Laterality: N/A;  . LEFT HEART CATH AND CORONARY ANGIOGRAPHY N/A 07/18/2019   Procedure: LEFT HEART CATH AND CORONARY ANGIOGRAPHY;  Surgeon: Lyn Records, MD;  Location: [facility] INVASIVE CV LAB;  Service: Cardiovascular;  Laterality: N/A;  . NO PAST SURGERIES       Current Meds  Medication Sig  . aspirin EC 81 MG tablet Take 1 tablet (81 mg total) by mouth daily.  Marland Kitchen atorvastatin (LIPITOR) 40 MG tablet Take 1 tablet (40 mg total) by mouth daily at 12 noon.  . diltiazem (CARDIZEM CD) 120 MG 24 hr capsule Take 1 capsule (120 mg total) by mouth daily.  . isosorbide mononitrate (IMDUR) 30 MG 24 hr tablet Take 1 tablet (30 mg total) by mouth daily.  . nitroGLYCERIN (NITROSTAT) 0.4 MG SL tablet  Place 1 tablet (0.4 mg total) under the tongue every 5 (five) minutes x 3 doses as needed for chest pain.     Allergies:   Patient has no known allergies.   Social History   Tobacco Use  . Smoking status: Former Smoker    Packs/day: 0.50    Types: Cigarettes    Quit date: 07/29/2019    Years since quitting: 0.7  . Smokeless tobacco: Never Used  Vaping Use  . Vaping Use: Never used  Substance Use Topics  . Alcohol use:  Not Currently    Comment: 3 times a week; last drink last night   . Drug use: Yes    Types: Marijuana    Comment: a few times a week     Family Hx: The patient's family history includes Arrhythmia in her paternal grandfather; Diabetes in an other family member; Healthy in her father and mother; Heart disease in her paternal grandfather; Hypertension in an other family member.  ROS:   Please see the history of present illness.     All other systems reviewed and are negative.   Prior CV studies:   The following studies were reviewed today:  Echo 07/17/2019 1. Left ventricular ejection fraction, by visual estimation, is 60 to  65%. The left ventricle has normal function. There is no left ventricular  hypertrophy.  2. The left ventricle has no regional wall motion abnormalities.  3. Global right ventricle has normal systolic function.The right  ventricular size is normal. No increase in right ventricular wall  thickness.  4. Left atrial size was normal.  5. Right atrial size was normal.  6. The mitral valve is normal in structure. Trivial mitral valve  regurgitation. No evidence of mitral stenosis.  7. The tricuspid valve is normal in structure.  8. The tricuspid valve is normal in structure. Tricuspid valve  regurgitation is not demonstrated.  9. The aortic valve is normal in structure. Aortic valve regurgitation is  not visualized. No evidence of aortic valve sclerosis or stenosis.  10. The pulmonic valve was grossly normal. Pulmonic valve regurgitation is  not visualized.  11. The inferior vena cava is normal in size with greater than 50%  respiratory variability, suggesting right atrial pressure of 3 mmHg.    Cath 07/18/2019  Normal left main  Normal LAD diagonals.  Normal circumflex  Right coronary, with relatively small distal territory.  Proximal RCA ectasia.  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  Normal LV function.  RECOMMENDATIONS:   Clinical  diagnosis is MINOCA vs CA spasm vs mild pericarditis.    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  Consider MRI, cardiac.   Labs/Other Tests and Data Reviewed:    EKG:  An ECG dated 08/13/2019 was personally reviewed today and demonstrated:  Sinus rhythm without significant ST-T wave changes.  Recent Labs: 07/17/2019: TSH 1.716 08/15/2019: Magnesium 2.0 05/01/2020: ALT 21; BUN 12; Creatinine, Ser 0.59; Hemoglobin 13.5; Platelets 217; Potassium 3.5; Sodium 134   Recent Lipid Panel Lab Results  Component Value Date/Time   CHOL 148 07/18/2019 02:53 AM   TRIG 39 07/18/2019 02:53 AM   HDL 78 07/18/2019 02:53 AM   CHOLHDL 1.9 07/18/2019 02:53 AM   LDLCALC 62 07/18/2019 02:53 AM    Wt Readings from Last 3 Encounters:  05/11/20 115 lb (52.2 kg)  11/01/19 108 lb (49 kg)  08/13/19 108 lb (49 kg)  Risk Assessment/Calculations:      Objective:    Vital Signs:  Ht 5\' 6"  (1.676 m)   Wt 115 lb (52.2 kg)   BMI 18.56 kg/m    VITAL SIGNS:  reviewed  ASSESSMENT & PLAN:    1. Coronary spasm: Patient was previously admitted in the beginning of this year due to NSTEMI, cardiac catheterization however did not show any obvious blockage. There was some suspicion that patient likely had coronary spasm.  Plavix was continued for 6 months before discontinuing during the last office visit.  Currently on aspirin, Lipitor, diltiazem and Imdur.  Continue on current therapy.  Patient has not had any recurrent symptoms since the previous admission.   Shared Decision Making/Informed Consent        COVID-19 Education: The signs and symptoms of COVID-19 were discussed with the patient and how to seek care for testing (follow up with PCP or arrange E-visit).  The importance of social distancing was discussed today.  Time:   Today, I have spent 10 minutes with the patient with telehealth technology discussing  the above problems.     Medication Adjustments/Labs and Tests Ordered: Current medicines are reviewed at length with the patient today.  Concerns regarding medicines are outlined above.   Tests Ordered: No orders of the defined types were placed in this encounter.   Medication Changes: No orders of the defined types were placed in this encounter.   Follow Up:  In Person in 6 month(s)  Signed, , Azalee Course  05/11/2020 11:32 AM    [facility]

## 2020-05-11 NOTE — Patient Instructions (Signed)
Medication Instructions:  Continue current medication  *If you need a refill on your cardiac medications before your next appointment, please call your pharmacy*   Lab Work: None Ordered   Testing/Procedures: None ordered   Follow-Up: At [facility], you and your health needs are our priority.  As part of our continuing mission to provide you with exceptional heart care, we have created designated Provider Care Teams.  These Care Teams include your primary Cardiologist (physician) and Advanced Practice Providers (APPs -  Physician Assistants and Nurse Practitioners) who all work together to provide you with the care you need, when you need it.  We recommend signing up for the patient portal called "MyChart".  Sign up information is provided on this After Visit Summary.  MyChart is used to connect with patients for Virtual Visits (Telemedicine).  Patients are able to view lab/test results, encounter notes, upcoming appointments, etc.  Non-urgent messages can be sent to your provider as well.   To learn more about what you can do with MyChart, go to [email or web address].    Your next appointment:   6 month(s)  The format for your next appointment:   In Person  Provider:   You may see Peter Swaziland, MD or one of the following Advanced Practice Providers on your designated Care Team:    Azalee Course, PA-C  Micah Flesher, PA-C or   Judy Pimple, [place]

## 2024-02-02 ENCOUNTER — Emergency Department (HOSPITAL_BASED_OUTPATIENT_CLINIC_OR_DEPARTMENT_OTHER)

## 2024-02-02 ENCOUNTER — Emergency Department (HOSPITAL_BASED_OUTPATIENT_CLINIC_OR_DEPARTMENT_OTHER)
Admission: EM | Admit: 2024-02-02 | Discharge: 2024-02-02 | Disposition: A | Discharge status: Home / Self Care | Attending: Emergency Medicine | Admitting: Emergency Medicine

## 2024-02-02 ENCOUNTER — Other Ambulatory Visit: Payer: Self-pay

## 2024-02-02 ENCOUNTER — Encounter (HOSPITAL_BASED_OUTPATIENT_CLINIC_OR_DEPARTMENT_OTHER): Payer: Self-pay

## 2024-02-02 DIAGNOSIS — S0081XA Abrasion of other part of head, initial encounter: Secondary | ICD-10-CM | POA: Diagnosis not present

## 2024-02-02 DIAGNOSIS — T148XXA Other injury of unspecified body region, initial encounter: Secondary | ICD-10-CM

## 2024-02-02 DIAGNOSIS — W01198A Fall on same level from slipping, tripping and stumbling with subsequent striking against other object, initial encounter: Secondary | ICD-10-CM | POA: Insufficient documentation

## 2024-02-02 DIAGNOSIS — S0990XA Unspecified injury of head, initial encounter: Secondary | ICD-10-CM | POA: Diagnosis present

## 2024-02-02 DIAGNOSIS — S0012XA Contusion of left eyelid and periocular area, initial encounter: Secondary | ICD-10-CM | POA: Diagnosis not present

## 2024-02-02 MED ORDER — BACITRACIN ZINC 500 UNIT/GM EX OINT: TOPICAL_OINTMENT | Freq: Two times a day (BID) | CUTANEOUS | Status: DC

## 2024-02-02 MED ORDER — ONDANSETRON 4 MG PO TBDP
8.0000 mg | ORAL_TABLET | Freq: Once | ORAL | Status: AC
  Administered 2024-02-02: 8 mg via ORAL
  Filled 2024-02-02: qty 2

## 2024-02-02 MED ORDER — ACETAMINOPHEN 500 MG PO TABS
1000.0000 mg | ORAL_TABLET | Freq: Once | ORAL | Status: AC
  Administered 2024-02-02: 1000 mg via ORAL
  Filled 2024-02-02: qty 2

## 2024-02-02 MED ORDER — OXYCODONE HCL 5 MG PO TABS
5.0000 mg | ORAL_TABLET | Freq: Once | ORAL | Status: AC
  Administered 2024-02-02: 5 mg via ORAL
  Filled 2024-02-02: qty 1

## 2024-02-02 NOTE — Discharge Instructions (Addendum)
 Evaluate for head injury was overall reassuring.  As we discussed please keep the abrasion on your forehead clean daily with soap and water, apply bacitracin  daily as well and can cover with a Band-Aid.  Please follow-up with your PCP.  If you develop facial droop, slurred speech, weakness or numbness in your extremities, persistent nausea and vomiting , inability to move your eye or vision changes or any other concerning symptom please return to the ED for further evaluation.
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
